# Patient Record
Sex: Male | Born: 1990 | Race: Black or African American | Hispanic: No | Marital: Single | Smoking: Former smoker
Health system: Southern US, Community
[De-identification: ages and names within clinical notes are randomized; demographics above are authoritative.]

## PROBLEM LIST (undated history)

## (undated) DIAGNOSIS — D571 Sickle-cell disease without crisis: Secondary | ICD-10-CM

---

## 2012-09-02 ENCOUNTER — Encounter (HOSPITAL_COMMUNITY): Payer: Self-pay | Admitting: Emergency Medicine

## 2012-09-02 ENCOUNTER — Inpatient Hospital Stay (HOSPITAL_COMMUNITY)
Admission: AD | Admit: 2012-09-02 | Discharge: 2012-09-04 | DRG: 426 | Disposition: A | Discharge status: Home / Self Care | Payer: BC Managed Care – PPO | Source: Ambulatory Visit | Attending: Psychiatry | Admitting: Psychiatry

## 2012-09-02 ENCOUNTER — Emergency Department (HOSPITAL_COMMUNITY)
Admission: EM | Admit: 2012-09-02 | Discharge: 2012-09-02 | Disposition: A | Discharge status: Other Acute Inpatient Hospital | Payer: BC Managed Care – PPO | Attending: Emergency Medicine | Admitting: Emergency Medicine

## 2012-09-02 DIAGNOSIS — F4321 Adjustment disorder with depressed mood: Principal | ICD-10-CM

## 2012-09-02 DIAGNOSIS — F1210 Cannabis abuse, uncomplicated: Secondary | ICD-10-CM | POA: Diagnosis present

## 2012-09-02 DIAGNOSIS — G4700 Insomnia, unspecified: Secondary | ICD-10-CM | POA: Diagnosis present

## 2012-09-02 DIAGNOSIS — R45851 Suicidal ideations: Secondary | ICD-10-CM | POA: Insufficient documentation

## 2012-09-02 DIAGNOSIS — F17200 Nicotine dependence, unspecified, uncomplicated: Secondary | ICD-10-CM | POA: Diagnosis present

## 2012-09-02 DIAGNOSIS — F329 Major depressive disorder, single episode, unspecified: Secondary | ICD-10-CM

## 2012-09-02 DIAGNOSIS — Z862 Personal history of diseases of the blood and blood-forming organs and certain disorders involving the immune mechanism: Secondary | ICD-10-CM | POA: Insufficient documentation

## 2012-09-02 DIAGNOSIS — D571 Sickle-cell disease without crisis: Secondary | ICD-10-CM | POA: Diagnosis present

## 2012-09-02 DIAGNOSIS — F3289 Other specified depressive episodes: Secondary | ICD-10-CM | POA: Insufficient documentation

## 2012-09-02 DIAGNOSIS — IMO0002 Reserved for concepts with insufficient information to code with codable children: Secondary | ICD-10-CM

## 2012-09-02 HISTORY — DX: Sickle-cell disease without crisis: D57.1

## 2012-09-02 LAB — CBC WITH DIFFERENTIAL/PLATELET
Basophils Absolute: 0.1 10*3/uL (ref 0.0–0.1)
Eosinophils Absolute: 0.1 10*3/uL (ref 0.0–0.7)
Lymphocytes Relative: 23 % (ref 12–46)
Lymphs Abs: 2.4 10*3/uL (ref 0.7–4.0)
MCV: 81.7 fL (ref 78.0–100.0)
Monocytes Relative: 18 % — ABNORMAL HIGH (ref 3–12)
Platelets: 326 10*3/uL (ref 150–400)
RDW: 15 % (ref 11.5–15.5)
WBC: 10.3 10*3/uL (ref 4.0–10.5)

## 2012-09-02 LAB — BASIC METABOLIC PANEL
BUN: 8 mg/dL (ref 6–23)
Calcium: 9.6 mg/dL (ref 8.4–10.5)
GFR calc Af Amer: >90 mL/min (ref >90)
GFR calc non Af Amer: >90 mL/min (ref >90)
Potassium: 3.9 mEq/L (ref 3.5–5.1)

## 2012-09-02 LAB — URINALYSIS, ROUTINE W REFLEX MICROSCOPIC
Bilirubin Urine: NEGATIVE
Glucose, UA: NEGATIVE mg/dL
Ketones, ur: NEGATIVE mg/dL
pH: 6 (ref 5.0–8.0)

## 2012-09-02 LAB — RAPID URINE DRUG SCREEN, HOSP PERFORMED
Amphetamines: NOT DETECTED
Barbiturates: NOT DETECTED
Benzodiazepines: NOT DETECTED

## 2012-09-02 MED ORDER — IBUPROFEN 200 MG PO TABS: 600.0000 mg | ORAL_TABLET | Freq: Three times a day (TID) | ORAL | Status: DC | PRN

## 2012-09-02 MED ORDER — LORAZEPAM 1 MG PO TABS: 1.0000 mg | ORAL_TABLET | Freq: Three times a day (TID) | ORAL | Status: DC | PRN

## 2012-09-02 MED ORDER — ONDANSETRON HCL 8 MG PO TABS: 4.0000 mg | ORAL_TABLET | Freq: Three times a day (TID) | ORAL | Status: DC | PRN

## 2012-09-02 MED ORDER — ZOLPIDEM TARTRATE 5 MG PO TABS: 5.0000 mg | ORAL_TABLET | Freq: Every evening | ORAL | Status: DC | PRN

## 2012-09-02 MED ORDER — ACETAMINOPHEN 325 MG PO TABS: 650.0000 mg | ORAL_TABLET | ORAL | Status: DC | PRN

## 2012-09-02 MED ORDER — ALUM & MAG HYDROXIDE-SIMETH 200-200-20 MG/5ML PO SUSP: 30.0000 mL | ORAL | Status: DC | PRN

## 2012-09-02 NOTE — ED Notes (Signed)
First meeting with patient. Patient is tearful and can not answer questions as to his hx. Patient was unable to eat dinner. Patient's mother states that he is a picky eater but has been unable to eat anything for the last few days. Patient denies any pain and denies any needs. Patient states he just does not want to be on earth anymore.

## 2012-09-02 NOTE — ED Provider Notes (Signed)
History  Scribed for [employer]. Rubin Payor, MD, the patient was seen in room TR08C/TR08C. This chart was scribed by Candelaria Stagers. The patient's care started at 2:33 PM   CSN: [id number]  Arrival date & time 09/02/12  1315   First MD Initiated Contact with Patient 09/02/12 1424      Chief Complaint  Patient presents with  . Depression     The history is provided by the patient. No language interpreter was used.   Garrett Valdez is a 21 y.o. male who presents to the Emergency Department complaining of depression that started about two years ago.  Pt states that he was brought in by his parents.  Parents are worried about him hurting himself.  Pt states that he does not have any plans to harm himself.  Pt smokes cigarettes and marijuana.  He denies any other drug use.  He has never been treated for depression before.     Past Medical History  Diagnosis Date  . Sickle cell anemia     History reviewed. No pertinent past surgical history.  No family history on file.  History  Substance Use Topics  . Smoking status: Current Every Day Smoker -- 1.0 packs/day  . Smokeless tobacco: Not on file  . Alcohol Use: Yes     Ocassionally       Review of Systems  Psychiatric/Behavioral:       Depression   All other systems reviewed and are negative.    Allergies  Sulfur; Claritin; Morphine and related; and Vitamin b complex  Home Medications  No current outpatient prescriptions on file.  BP 115/40  Pulse 66  Temp 98.5 F (36.9 C) (Oral)  Resp 16  SpO2 98%  Physical Exam  Nursing note and vitals reviewed. Constitutional: He is oriented to person, place, and time. He appears well-developed and well-nourished. No distress.       Flat affect, appears depressed.    HENT:  Head: Normocephalic and atraumatic.  Eyes: EOM are normal. Pupils are equal, round, and reactive to light.  Neck: Neck supple. No tracheal deviation present.  Musculoskeletal: Normal range of motion. He  exhibits no edema.  Neurological: He is alert and oriented to person, place, and time. No sensory deficit.  Skin: Skin is warm and dry.  Psychiatric: He has a normal mood and affect. His behavior is normal.    ED Course  Procedures   DIAGNOSTIC STUDIES:   COORDINATION OF CARE: 13:57 Ordered: Urinalysis, Routine w reflex microscopic; Drug screen panel, emergency, CBC with Differential; Basic metabolic panel; Ethanol     Labs Reviewed  URINALYSIS, ROUTINE W REFLEX MICROSCOPIC  URINE RAPID DRUG SCREEN (HOSP PERFORMED)  CBC WITH DIFFERENTIAL  BASIC METABOLIC PANEL  ETHANOL   No results found.   No diagnosis found.    MDM  Patient with depression. No known history of same. He states his been going on for 2 years. No clear suicidal statements. His family he reportedly made him come in. Labwork is pending, however patient is most likely medically cleared. He does smoke marijuana almost daily. He'll be seen by the ACT team.  I personally performed the services described in this documentation, which was scribed in my presence. The recorded information has been reviewed and considered.         Juliet Rude. Rubin Payor, MD 09/02/12 1501

## 2012-09-02 NOTE — ED Notes (Signed)
Gave report to Bonita Quin, Charity fundraiser at [facility].
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
## 2012-09-02 NOTE — ED Notes (Signed)
Family at bedside. 

## 2012-09-02 NOTE — ED Notes (Signed)
Patient is very emotional. He is crying and states "I don't want to be here on earth anymore".

## 2012-09-02 NOTE — ED Notes (Signed)
Patient's mother Larene Beach) can be reached at [phone].

## 2012-09-02 NOTE — ED Notes (Signed)
The patient is AOx4 and stable for transport.  He is being accompanied by security and a Comptroller.

## 2012-09-02 NOTE — ED Notes (Signed)
Pt was brought here by his parents. Stated his parents brought him here to be treated for depression. Pt is upset and crying. Pt stated that at one time he thought it would be better off without him here. Pt stated that he does not have any plans to harm himself or other. Denies hallucinations. Pt stated that he has never been tx for depression before.

## 2012-09-03 ENCOUNTER — Encounter (HOSPITAL_COMMUNITY): Payer: Self-pay | Admitting: *Deleted

## 2012-09-03 DIAGNOSIS — F329 Major depressive disorder, single episode, unspecified: Secondary | ICD-10-CM | POA: Insufficient documentation

## 2012-09-03 LAB — TSH: TSH: 1.218 u[IU]/mL (ref 0.350–4.500)

## 2012-09-03 LAB — PATHOLOGIST SMEAR REVIEW

## 2012-09-03 MED ORDER — MAGNESIUM HYDROXIDE 400 MG/5ML PO SUSP: 30.0000 mL | Freq: Every day | ORAL | Status: DC | PRN

## 2012-09-03 MED ORDER — NICOTINE 21 MG/24HR TD PT24
21.0000 mg | MEDICATED_PATCH | Freq: Every day | TRANSDERMAL | Status: DC
  Filled 2012-09-03: qty 1

## 2012-09-03 MED ORDER — TRAZODONE HCL 50 MG PO TABS
50.0000 mg | ORAL_TABLET | Freq: Every evening | ORAL | Status: DC | PRN
  Administered 2012-09-03 (×2): 50 mg via ORAL
  Filled 2012-09-03 (×5): qty 1

## 2012-09-03 NOTE — Social Work (Signed)
Aftercare Planning Group: 09/03/2012 9:45 AM  Pt did not attend d/c planning group on this date.  SW met with pt individually at this time.  Pt presents with bright mood and affect.  Pt states that he came to the hospital due to his mom overreacting and worrying about him.  Pt states that him and his mom got into an argument over text and took something he said as SI.  Pt denies SI and states that it was a misunderstanding.  Pt states that he lives in [place] and has access to transportation.  Pt is open to following up with his PCP.  SW will make appropriate referrals.  No further needs voiced by pt at this time.    [facility] Group Notes:  (Counselor/Nursing/MHT/Case Management/Adjunct)  09/03/2012 2:14 PM  Type of Therapy:  Group Therapy  Participation Level:  Active  Participation Quality:  Appropriate and Attentive  Affect:  Appropriate  Cognitive:  Alert and Appropriate  Insight:  Good  Engagement in Group:  Good  Engagement in Therapy:  Good  Modes of Intervention:  Clarification, Education, Problem-solving, Socialization and Support  Summary of Progress/Problems: The topic of discussion today was emotion regulation.  Pt actively participated in discussing what this looks like for them and how it has both positively and negatively affected their life.  Pt was able to give real life examples of how this plays out for them.     Carmina Michon 09/03/2012, 2:14 PM
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
## 2012-09-03 NOTE — Progress Notes (Signed)
[facility] MD Progress Note  09/03/2012 3:14 PM  Diagnosis:   Axis I: Major Depression, single episode Axis II: Deferred Axis III:  Past Medical History  Diagnosis Date  . Sickle cell anemia    Axis IV: problems with primary support group Axis V: 61-70 mild symptoms  ADL's:  Intact  Sleep: Fair  Appetite:  Fair  Suicidal Ideation:  Plan:  Denies Intent:  Denies Means:  Denies Homicidal Ideation:  Plan:  Denies Intent:  Denies Means:  Denies  Admits that 2 months ago was feeling down depressed and went to see a counselor who kept him for three hours talking about things. He was wanting to send him to get some sleeping medications, but he did not pursue it. He did not go back. He admits that he has been under a lot of stress. 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 He is also involved in martial arts. He admits that he has been sad, at times feels like crying, His sleep is erratic in part because of his work schedule. He is in a committed relationship with his girlfriend. His mother has been worried about him as he is "isolating" He came here as he had sent a text to his mother and she felt he was saying he was going to hurt himself. He was given the ultimatum of coming here or to a "mental institution" in [place], where his mother works.  Mental Status Examination/Evaluation: Objective:  Appearance: Disheveled  Eye Contact::  Fair  Speech:  Clear and Coherent  Volume:  Normal  Mood:  Sad, anxious, worried  Affect:  Restricted  Thought Process:  Coherent, Linear and Logical  Orientation:  Full  Thought Content:  WDL  Suicidal Thoughts:  No  Homicidal Thoughts:  No  Memory:  Immediate;   Fair Recent;   Fair Remote;   Fair  Judgement:  Fair  Insight:  Shallow  Psychomotor Activity:  Normal  Concentration:  Fair  Recall:  Fair  Akathisia:  No  Handed:  Right  AIMS (if indicated):     Assets:  Communication Skills Housing Intimacy Social Support Transportation Vocational/Educational   Sleep:  Number of Hours: 3.5    Vital Signs:Blood pressure 117/49, pulse 56, temperature 98.3 F (36.8 C), temperature source Oral, resp. rate 16, height 5' 6.5" (1.689 m), weight 51.256 kg (113 lb). Current Medications: Current Facility-Administered Medications  Medication Dose Route Frequency Provider Last Rate Last Dose  . magnesium hydroxide (MILK OF MAGNESIA) suspension 30 mL  30 mL Oral Daily PRN Kerry Hough, PA      . traZODone (DESYREL) tablet 50 mg  50 mg Oral QHS,MR X 1 Kerry Hough, PA   50 mg at 09/03/12 0150  . DISCONTD: nicotine (NICODERM CQ - dosed in mg/24 hours) patch 21 mg  21 mg Transdermal Q0600 Kerry Hough, PA       Facility-Administered Medications Ordered in Other Encounters  Medication Dose Route Frequency Provider Last Rate Last Dose  . DISCONTD: acetaminophen (TYLENOL) tablet 650 mg  650 mg Oral Q4H PRN Juliet Rude. Pickering, MD      . DISCONTD: alum & mag hydroxide-simeth (MAALOX/MYLANTA) 200-200-20 MG/5ML suspension 30 mL  30 mL Oral PRN Juliet Rude. Pickering, MD      . DISCONTD: ibuprofen (ADVIL,MOTRIN) tablet 600 mg  600 mg Oral Q8H PRN Juliet Rude. Pickering, MD      . DISCONTD: LORazepam (ATIVAN) tablet 1 mg  1 mg Oral Q8H PRN Juliet Rude. Rubin Payor, MD      .  DISCONTD: ondansetron (ZOFRAN) tablet 4 mg  4 mg Oral Q8H PRN Juliet Rude. Pickering, MD      . DISCONTD: zolpidem (AMBIEN) tablet 5 mg  5 mg Oral QHS PRN Juliet Rude. Rubin Payor, MD        Lab Results:  Results for orders placed during the hospital encounter of 09/02/12 (from the past 48 hour(s))  TSH     Status: Normal   Collection Time   09/03/12  6:35 AM      Component Value Range Comment   TSH 1.218  0.350 - 4.500 uIU/mL     Physical Findings: AIMS: Facial and Oral Movements Muscles of Facial Expression: None, normal Lips and Perioral Area: None, normal Jaw: None, normal Tongue: None, normal,Extremity Movements Upper (arms, wrists, hands, fingers): None, normal Lower (legs, knees, ankles,  toes): None, normal,  , Overall Severity Severity of abnormal movements (highest score from questions above): None, normal Incapacitation due to abnormal movements: None, normal Patient's awareness of abnormal movements (rate only patient's report): No Awareness, Dental Status Current problems with teeth and/or dentures?: No Does patient usually wear dentures?: No  CIWA:    COWS:     Treatment Plan Summary: Daily contact with patient to assess and evaluate symptoms and progress in treatment Medication management  Plan: Get more information/coping skills/stress management/CBT           Consider starting an antidepressant  Nachmen Mansel A 09/03/2012, 3:14 PM

## 2012-09-03 NOTE — Progress Notes (Signed)
[facility] Group Notes:  (Counselor/Nursing/MHT/Case Management/Adjunct)  09/03/2012 1:15-2:30 Emotion Regulation Group  Type of Therapy:  Group Therapy  Participation Level:  Active  Participation Quality:  Appropriate and Attentive  Affect:  Appropriate  Cognitive:  Alert, Appropriate and Oriented  Insight:  Good  Engagement in Group:  Good  Engagement in Therapy:  Good  Modes of Intervention:  Problem-solving and Exploration  Summary of Progress/Problems: Patient was supportive of other patients during group.  He shared openly about past problems and his decisions that allowed him to overcome those obstacles.   Terrel Nesheiwat L 09/03/2012, 3:20 PM

## 2012-09-03 NOTE — Progress Notes (Addendum)
[facility] Adult Inpatient Family/Significant Other Suicide Prevention Education  Suicide Prevention Education:  Education Completed; Garrett Valdez - mother [phone]),  (name of family member/significant other) has been identified by the patient as the family member/significant other with whom the patient will be residing, and identified as the person(s) who will aid the patient in the event of a mental health crisis (suicidal ideations/suicide attempt).  With written consent from the patient, the family member/significant other has been provided the following suicide prevention education, prior to the and/or following the discharge of the patient.  The suicide prevention education provided includes the following:  Suicide risk factors  Suicide prevention and interventions  National Suicide Hotline telephone number  [facility] assessment telephone number  [facility] Emergency Assistance 911  [facility] and/or Residential Mobile Crisis Unit telephone number  Request made of family/significant other to:  Remove weapons (e.g., guns, rifles, knives), all items previously/currently identified as safety concern.    Remove drugs/medications (over-the-counter, prescriptions, illicit drugs), all items previously/currently identified as a safety concern.  The family member/significant other verbalizes understanding of the suicide prevention education information provided.  The family member/significant other agrees to remove the items of safety concern listed above. Garrett Valdez states that pt was showing signs of depression such as tearfulness and hopeless/helpless feelings.  Garrett Valdez states that she works at a mental health hospital in [place] and told Garrett Valdez that he either goes to [facility] or her hospital to be assessed.  Garrett Valdez agreed that pt appears to be doing fine now.  Garrett Valdez states that she would like for Garrett Valdez to talk to a therapist but understand he isn't ready  to.  Garrett Valdez had no further concerns about pt's discharge.    Garrett Valdez 09/03/2012, 3:29 PM

## 2012-09-03 NOTE — Progress Notes (Signed)
[facility] Group Notes:  (Counselor/Nursing/MHT/Case Management/Adjunct)  09/03/2012 12:16 PM  Type of Therapy:  Psychoeducational Skills  Participation Level:  Active  Participation Quality:  Appropriate, Attentive and Sharing  Affect:  Appropriate  Cognitive:  Alert, Appropriate and Oriented  Insight:  Good  Engagement in Group:  Good  Engagement in Therapy:  n/a  Modes of Intervention:  Activity, Education, Problem-solving, Socialization and Support  Summary of Progress/Problems: Garrett Valdez attended Psychoeducational group that focused on using quality time with support systems/individuals to engage in healthy coping skills. Garrett Valdez participated in activity guessing about self and peers. Garrett Valdez was active while group discussed who their support systems are, how they can spend positive quality time together as a way to strengthen the relationship and use coping skills. Garrett Valdez was given a homework assignment to find two ways to improve his support systems and twenty activities he can do to spend quality time with his supports.   Wandra Scot 09/03/2012, 12:16 PM

## 2012-09-03 NOTE — Progress Notes (Signed)
Patient states "I do not belong here, I do not want to be here." Patient educated re 72 hour notice. Patient verbalized understanding of 72 hour notice. Patient elected to sign 72 hour notice today, 08-04-2012 at 0914. MD aware. Will continue to monitor.
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
## 2012-09-03 NOTE — Tx Team (Signed)
Initial Interdisciplinary Treatment Plan  PATIENT STRENGTHS: (choose at least two) Average or above average intelligence Capable of independent living Financial means Religious Affiliation  PATIENT STRESSORS: Health problems Marital or family conflict   PROBLEM LIST: Problem List/Patient Goals Date to be addressed Date deferred Reason deferred Estimated date of resolution  Suicide Ideation 09/03/12     Depression 09/03/12                                                DISCHARGE CRITERIA:  Ability to meet basic life and health needs Adequate post-discharge living arrangements Improved stabilization in mood, thinking, and/or behavior Motivation to continue treatment in a less acute level of care Verbal commitment to aftercare and medication compliance  PRELIMINARY DISCHARGE PLAN: Outpatient therapy Return to previous living arrangement  PATIENT/FAMIILY INVOLVEMENT: This treatment plan has been presented to and reviewed with the patient, Garrett Valdez, and/or family member.  The patient and family have been given the opportunity to ask questions and make suggestions.  Roselie Skinner [facility] 09/03/2012, 1:25 AM

## 2012-09-03 NOTE — Progress Notes (Signed)
  D) Patient pleasant and cooperative upon my assessment. Patient resting quietly in bed after breakfast. Patient states slept " okay," and  appetite is "okay." Patient rates depression as   0/10, patient rates hopeless feelings as  0 /10. Patient denies SI/HI, denies A/V hallucinations.   A) Patient offered support and encouragement, patient encouraged to discuss feelings/concerns with staff. Patient verbalized understanding. Patient monitored Q15 minutes for safety. Patient met with MD and treatment team to discuss today's goals and plan of care.  R) Patient active on unit, attending some groups in day room and meals in dining room.  Patient taking medications as ordered. Will continue to monitor.

## 2012-09-04 DIAGNOSIS — F4321 Adjustment disorder with depressed mood: Principal | ICD-10-CM

## 2012-09-04 NOTE — Progress Notes (Signed)
Psychoeducational Group Note  Date:  09/04/2012 Time:  2000  Group Topic/Focus:  Wrap-Up Group:   The focus of this group is to help patients review their daily goal of treatment and discuss progress on daily workbooks.  Participation Level:  Active  Participation Quality:  Attentive  Affect:  Appropriate  Cognitive:  Appropriate  Insight:  Good  Engagement in Group:  Good  Additional Comments:  Patient shared that today was a good day and a "straight day" and that he looked forward to going home soon.  Garrett Valdez, Newton Pigg 09/04/2012, 12:33 AM

## 2012-09-04 NOTE — [facility] Suicide Risk Assessment (Signed)
Suicide Risk Assessment  Discharge Assessment     Demographic Factors:  Adolescent or young adult  Mental Status Per Nursing Assessment::   On Admission:   (denied)  Current Mental Status by Physician: In full contact with reality. Mood euthymic affect bright broad. Denies any active suicidal, plans, intent. Adds that he was never suicidal.   Loss Factors: NA  Historical Factors: None  Risk Reduction Factors:   Sense of responsibility to family, Employed, Living with another person, especially a relative, Positive social support and Positive coping skills or problem solving skills  Continued Clinical Symptoms: Depressive symptoms resolved  Cognitive Features That Contribute To Risk: None    Suicide Risk:  Minimal: No identifiable suicidal ideation.  Patients presenting with no risk factors but with morbid ruminations; may be classified as minimal risk based on the severity of the depressive symptoms  Discharge Diagnoses:   AXIS I:  Adjustment Disorder with Depressed Mood AXIS II:  Deferred AXIS III:   Past Medical History  Diagnosis Date  . Sickle cell anemia    AXIS IV:  Responsabilites AXIS V:  61-70 mild symptoms  Plan Of Care/Follow-up recommendations:  Activity:  Back to work Diet:  Regular Wants to follow up with his PCP who has known him since he was a child and follow his recommendations. Willing to work in therapy, not wanting to take medications. Found out that his girl friend is pregnant. Very excited about it. Is patient on multiple antipsychotic therapies at discharge:  No   Has Patient had three or more failed trials of antipsychotic monotherapy by history:  No  Recommended Plan for Multiple Antipsychotic Therapies: N/A  Marton Malizia A 09/04/2012, 11:03 AM

## 2012-09-04 NOTE — Progress Notes (Signed)
[facility] Case Management Discharge Plan:  Will you be returning to the same living situation after discharge: Yes,  returning home At discharge, do you have transportation home?:Yes,  access to transportation Do you have the ability to pay for your medications:Yes,  access to meds   Release of information consent forms completed and in the chart;  Patient's signature needed at discharge.  Patient to Follow up at:  Follow-up Information    Follow up with Dr. Hart Robinsons (primary care doctor). On 09/11/2012. (Appointment scheduled at 2:30 pm)    Contact information:   [address] [place], [place] [address] [phone] Fax: [phone]         Patient denies SI/HI:   Yes,  denies SI/HI    Safety Planning and Suicide Prevention discussed:  Yes,  discussed with pt today  Barrier to discharge identified:No.  Summary and Recommendations: Pt attended discharge planning group and actively participated in group.  SW provided pt with today's workbook.  Pt presents with bright mood and affect.  Pt denies depression, anxiety and SI/HI.  Pt reports feeling stable to d/c today.  No recommendations from SW.  No further needs voiced by pt.  Pt stable to discharge.     Garrett Valdez 09/04/2012, 11:55 AM

## 2012-09-04 NOTE — Progress Notes (Signed)
Patient denies SI/HI, denies A/V hallucinations. Patient verbalizes understanding of discharge instructions, follow up care and prescriptions. Patient given all belongings from [facility] locker. Patient escorted out by staff, transported by family. 

## 2012-09-04 NOTE — Progress Notes (Signed)
Pt reports he is doing fine.  He denies SI/HI/AV.  He says he really doesn't belong here and wants to go home.  He signed a 72 hr request for discharge earlier today.  Presently he is in the dayroom watching TV.  Pt encouraged to make his needs known to staff.  Pt voiced understanding and voices no needs/complaints at this time.  Safety maintained with q15 minute checks.
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
## 2012-09-05 NOTE — Tx Team (Signed)
Interdisciplinary Treatment Plan Update (Adult)  Date:  09/05/2012  Time Reviewed:  8:06 AM   Progress in Treatment: Attending groups: Yes Participating in groups:  Yes Taking medication as prescribed: Yes Tolerating medication:  Yes Family/Significant othe contact made:  Pt refused Patient understands diagnosis:  Yes Discussing patient identified problems/goals with staff:  Yes Medical problems stabilized or resolved:  Yes Denies suicidal/homicidal ideation: Yes Issues/concerns per patient self-inventory:  None identified Other: N/A  New problem(s) identified: None Identified  Reason for Continuation of Hospitalization: Stable to d/c  Interventions implemented related to continuation of hospitalization: Stable to d/c  Additional comments: N/A  Estimated length of stay: D/C today  Discharge Plan: Pt will follow up with   New goal(s): N/A  Review of initial/current patient goals per problem list:    1.  Goal (s): Reduce depressive and anxiety symptoms  Met:  Yes  Target date: by discharge  As evidenced by: Reducing depression from a 10 to a 3 as reported by pt.  Pt rates at a 0 today.   2.  Goal(s): Eliminate SI  Met:  Yes  Target date: by discharge  As evidenced by: Pt denies SI.    Attendees: Patient:   09/05/2012 8:06 AM   Family:     Physician:  Geoffery Lyons, MD 09/05/2012 8:06 AM   Nursing:  09/05/2012 8:06 AM   Clinical Social Worker:  Reyes Ivan, LCSWA 09/05/2012 8:06 AM   Other:  09/05/2012 8:06 AM   Other:     Other:     Other:     Other:      Scribe for Treatment Team:   Reyes Ivan 09/05/2012 8:06 AM

## 2012-09-08 NOTE — Progress Notes (Signed)
Patient Discharge Instructions:  After Visit Summary (AVS):   Faxed to:  09/08/12 Psychiatric Admission Assessment Note:   Faxed to:  09/08/12 Suicide Risk Assessment - Discharge Assessment:   Faxed to:  09/08/12 Faxed/Sent to the Next Level Care provider:  09/08/12 Faxed to Dr. Hart Robinsons @ [phone] Jerelene Redden, 09/08/2012, 2:59 PM

## 2012-09-10 NOTE — Progress Notes (Signed)
[facility] MD Progress Note  09/10/2012 4:25 PM Garrett Valdez  MRN:  [id number]  Diagnosis:   Axis I: Mood Disorder NOS, Cannabis Abuse Axis II: Deferred Axis III:  Past Medical History  Diagnosis Date  . Sickle cell anemia    Axis IV: educational problems, occupational problems and problems with primary support group Axis V: 51-60 moderate symptoms  ADL's:  Intact  Sleep: Fair  Appetite:  Fair  Suicidal Ideation:  Plan:  Denies Intent:  Denies Means:  Denies Homicidal Ideation:  Plan:  Denies Intent:  Denies Means:  denies  Giordano wants to leave the hospital. He has no clear plan on where he is going to go, has no transportation, He is very guarded, irritated when asked what is he planning to do. His family will be able to pick him up in the morning. He does not want to wait. Mental Status Examination/Evaluation: Objective:  Appearance: Fairly Groomed  Patent attorney::  Fair  Speech:  Clear and Coherent  Volume:  Increased  Mood:  Angry and Irritable  Affect:  irritated, defiant  Thought Process:  Coherent and Goal Directed  Orientation:  Full  Thought Content:  Wanting to leave the hospital. Claiming that he said he was going to hurt himself because he wanted a place to stay  Suicidal Thoughts:  No  Homicidal Thoughts:  No  Memory:  Immediate;   Fair Recent;   Fair Remote;   Fair  Judgement:  Poor  Insight:  Lacking  Psychomotor Activity:  Increased  Concentration:  Fair  Recall:  Fair  Akathisia:  No  Handed:  Right  AIMS (if indicated):     Assets:  Communication Skills  Sleep:  Number of Hours: 6    Vital Signs:Blood pressure 130/66, pulse 59, temperature 98.6 F (37 C), temperature source Oral, resp. rate 15, height 5' 6.5" (1.689 m), weight 51.256 kg (113 lb). Current Medications: No current facility-administered medications for this encounter.   No current outpatient prescriptions on file.    Lab Results: No results found for this or any previous  visit (from the past 48 hour(s)).   Will hold him until the morning when his family will pick him up. He is labile, impulsive. Will work on Pharmacologist, anger management and try to establish a better plan for follow up. Jalana Moore A 09/10/2012, 4:25 PM

## 2012-09-15 NOTE — H&P (Signed)
Agree with assessment and disposition  Shakela Donati A. Yulian Gosney, M.D. 

## 2012-09-16 NOTE — Discharge Summary (Signed)
Physician Discharge Summary Note  Patient:  Garrett Valdez is an 21 y.o., male MRN:  [id number] DOB:  25-Jul-1991 Patient phone:  [phone] (home)  Patient address:   [address] [place] [address]   Date of Admission:  09/02/2012 Date of Discharge: 09/04/2012  Discharge Diagnoses: Principal Problem:  *Adjustment disorder with depressed mood  Axis Diagnosis:  Discharge Diagnoses:  AXIS I: Adjustment Disorder with Depressed Mood  AXIS II: Deferred  AXIS III:  Past Medical History   Diagnosis  Date   .  Sickle cell anemia    AXIS IV: Responsabilites  AXIS V: 61-70 mild symptoms   Level of Care:  OP  Hospital Course: Tadd was admitted for crisis management and stabilization after presenting to the ED reporting increasing depression with thoughts of suicide. He did not have a plan, but stated he "just didn't want to be on the planet anymore."  He presented with no risk factors but only morbid ruminations.  He was felt to be a minimal risk for suicide.  And upon discharge the patient acknowledged that he was never suicidal.  He did not want to take medication, his mood was euthymic and bright and he had no loss factors.   He was noted to have positive social support and was willing to work on any remaining issues with an outpatient therapist.  Medication List  Notice    You have not been prescribed any medications.  They were also enrolled in group counseling sessions and activities in which they participated actively. Follow-up Information    Follow up with Dr. Hart Robinsons (primary care doctor). On 09/11/2012. (Appointment scheduled at 2:30 pm)    Contact information:   [address] [place], [place] [address] [phone] Fax: [phone]   Upon discharge, patient adamantly denies suicidal, homicidal ideations, auditory, visual hallucinations and or delusional thinking. They left [facility] with all personal belongings via personal transportation in no apparent  distress.  Consults:  none  Significant Diagnostic Studies:  none  Discharge Vitals:   Blood pressure 130/66, pulse 59, temperature 98.6 F (37 C), temperature source Oral, resp. rate 15, height 5' 6.5" (1.689 m), weight 51.256 kg (113 lb)..  Mental Status Exam: See Mental Status Examination and Suicide Risk Assessment completed by Attending Physician prior to discharge.  Discharge destination:  Home  Is patient on multiple antipsychotic therapies at discharge:  No  Has Patient had three or more failed trials of antipsychotic monotherapy by history: N/A Recommended Plan for Multiple Antipsychotic Therapies: N/A Follow-up recommendations:   Activities: Resume typical activities Diet: Resume typical diet Tests: none Other: Follow up with outpatient provider and report any side effects to out patient prescriber.  Comments:   Patient is instructed and cautioned to not engage in alcohol and or illegal drug use while on prescription medicines. In the event of worsening symptoms, patient is instructed to call the crisis hotline, 911 and or go to the nearest ED for appropriate evaluation and treatment of symptoms.  Signed:  Rona Ravens. Fallon Howerter [facility] 09/16/2012 3:53 PM

## 2012-09-21 NOTE — Discharge Summary (Signed)
Agree with assessment and plan Danajah Birdsell A. Cing , M.D. 

## 2015-01-07 ENCOUNTER — Emergency Department (INDEPENDENT_AMBULATORY_CARE_PROVIDER_SITE_OTHER)
Admission: EM | Admit: 2015-01-07 | Discharge: 2015-01-07 | Disposition: A | Discharge status: Home / Self Care | Payer: BLUE CROSS/BLUE SHIELD | Source: Home / Self Care | Attending: Family Medicine | Admitting: Family Medicine

## 2015-01-07 ENCOUNTER — Encounter (HOSPITAL_COMMUNITY): Payer: Self-pay | Admitting: Emergency Medicine

## 2015-01-07 DIAGNOSIS — J029 Acute pharyngitis, unspecified: Secondary | ICD-10-CM

## 2015-01-07 LAB — POCT RAPID STREP A: STREPTOCOCCUS, GROUP A SCREEN (DIRECT): NEGATIVE

## 2015-01-07 NOTE — Discharge Instructions (Signed)

## 2015-01-07 NOTE — ED Provider Notes (Signed)
CSN: [id number]     Arrival date & time 01/07/15  1023 History   First MD Initiated Contact with Patient 01/07/15 1110     Chief Complaint  Patient presents with  . Sore Throat  . Otalgia    right   (Consider location/radiation/quality/duration/timing/severity/associated sxs/prior Treatment) HPI  24 y/o male with sickle cell [place] disease here with 2 days of sore throat, malaise, tender LAD, and nausea. He states that his sore throat has persisted over the last 2 days but now it hurts so bad that he has a hard time eating. He is still drinking plenty of fluids. HE also notes R sided tender LAD just below his ear. He denies cough, dyspnea, muscle pain, and chest pain. He has not had strep for 2 years. He takes amox daily and has recently increased his dose to try to treat the strep. 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 He also reports a single episode of emesis just after the sore throat started 2 days ago.   Past Medical History  Diagnosis Date  . Sickle cell anemia    History reviewed. No pertinent past surgical history. History reviewed. No pertinent family history. History  Substance Use Topics  . Smoking status: Current Every Day Smoker -- 0.10 packs/day    Types: Cigarettes  . Smokeless tobacco: Never Used  . Alcohol Use: No    Review of Systems  Constitutional: Positive for activity change. Negative for fever, chills, diaphoresis and appetite change.  HENT: Positive for sore throat. Negative for congestion.   Respiratory: Negative for chest tightness and shortness of breath.   Cardiovascular: Negative for chest pain.     Per HPI  Allergies  Sulfur; Claritin; Morphine and related; and Vitamin b complex  Home Medications   Prior to Admission medications   Medication Sig Start Date End Date Taking? Authorizing Provider  amoxicillin (AMOXIL) 500 MG capsule Take 250 mg by mouth 2 (two) times daily.   Yes Historical Provider, MD   BP 115/72 mmHg  Pulse  81  Temp(Src) 98 F (36.7 C) (Oral)  Resp 16  SpO2 99% Physical Exam  Constitutional: He is oriented to person, place, and time. He appears well-developed and well-nourished. No distress.  HENT:  Head: Normocephalic and atraumatic.  Mouth/Throat: No oropharyngeal exudate or tonsillar abscesses.  Swollen red and swollen with exudates.   Eyes: Conjunctivae are normal. Right eye exhibits no discharge. Left eye exhibits no discharge.  Neck: Neck supple.  Cardiovascular: Normal rate, regular rhythm and normal heart sounds.   No murmur heard. Pulmonary/Chest: Effort normal and breath sounds normal. No respiratory distress.  Musculoskeletal: He exhibits no edema.  Lymphadenopathy:    He has cervical adenopathy.  Neurological: He is alert and oriented to person, place, and time.  Skin: He is not diaphoretic.  Nursing note and vitals reviewed.   ED Course  Procedures (including critical care time) Labs Review Labs Reviewed  POCT RAPID STREP A ([facility] CARE ONLY)    Imaging Review No results found.   MDM   1. Pharyngitis    24 y/o male with Sickle cell [place] disease here with sore throat consistent with strep pharyngitis. His rapid strep is negative but given his nausea, tender LAD, tonsillar exudates, and severe sore throat we will go ahead and treat for presumed amox resistant strep pharyngitis with omnicef. Red flags for return provided, discussed supportive care.   Pt seen and discussed with Dr. Artis Flock and  he agrees with the plan as discussed above.      Elenora GammaSamuel L Bradshaw, MD 01/07/15 1200

## 2015-01-07 NOTE — ED Notes (Signed)
Pt started with right ear pain and a sore throat two days ago.  Pt denies any nasal congestion, fever, or cough.

## 2015-01-09 LAB — CULTURE, GROUP A STREP

## 2015-01-09 NOTE — ED Notes (Signed)
Throat culture: Strep beta hemolytic not group A.  Pt. adequately treated with Amoxicillin.  Needs notified. 01/09/2015

## 2015-01-11 ENCOUNTER — Telehealth (HOSPITAL_COMMUNITY): Payer: Self-pay | Admitting: *Deleted

## 2015-01-11 ENCOUNTER — Encounter (HOSPITAL_COMMUNITY): Payer: Self-pay | Admitting: *Deleted

## 2015-01-11 ENCOUNTER — Inpatient Hospital Stay (HOSPITAL_COMMUNITY)
Admission: EM | Admit: 2015-01-11 | Discharge: 2015-01-16 | DRG: 812 | Disposition: A | Discharge status: Home / Self Care | Payer: BLUE CROSS/BLUE SHIELD | Attending: Internal Medicine | Admitting: Internal Medicine

## 2015-01-11 DIAGNOSIS — Z79899 Other long term (current) drug therapy: Secondary | ICD-10-CM | POA: Diagnosis not present

## 2015-01-11 DIAGNOSIS — F17210 Nicotine dependence, cigarettes, uncomplicated: Secondary | ICD-10-CM | POA: Diagnosis present

## 2015-01-11 DIAGNOSIS — J029 Acute pharyngitis, unspecified: Secondary | ICD-10-CM | POA: Diagnosis present

## 2015-01-11 DIAGNOSIS — D5700 Hb-SS disease with crisis, unspecified: Principal | ICD-10-CM | POA: Diagnosis present

## 2015-01-11 DIAGNOSIS — Z9114 Patient's other noncompliance with medication regimen: Secondary | ICD-10-CM | POA: Diagnosis present

## 2015-01-11 LAB — COMPREHENSIVE METABOLIC PANEL
ALT: 10 U/L (ref 0–53)
ALT: 11 U/L (ref 0–53)
AST: 24 U/L (ref 0–37)
AST: 22 U/L (ref 0–37)
Albumin: 4.2 g/dL (ref 3.5–5.2)
Albumin: 4.2 g/dL (ref 3.5–5.2)
Alkaline Phosphatase: 79 U/L (ref 39–117)
Alkaline Phosphatase: 74 U/L (ref 39–117)
Anion gap: 5 (ref 5–15)
Anion gap: 3 — ABNORMAL LOW (ref 5–15)
BILIRUBIN TOTAL: 1.5 mg/dL — AB (ref 0.3–1.2)
BUN: 6 mg/dL (ref 6–23)
BUN: 6 mg/dL (ref 6–23)
CALCIUM: 8.8 mg/dL (ref 8.4–10.5)
CHLORIDE: 108 mmol/L (ref 96–112)
CO2: 25 mmol/L (ref 19–32)
CO2: 28 mmol/L (ref 19–32)
CREATININE: 0.84 mg/dL (ref 0.50–1.35)
Calcium: 8.7 mg/dL (ref 8.4–10.5)
Chloride: 108 mmol/L (ref 96–112)
Creatinine, Ser: 0.7 mg/dL (ref 0.50–1.35)
GFR calc Af Amer: >90 mL/min (ref >90)
GLUCOSE: 82 mg/dL (ref 70–99)
Glucose, Bld: 116 mg/dL — ABNORMAL HIGH (ref 70–99)
POTASSIUM: 4 mmol/L (ref 3.5–5.1)
Potassium: 3.9 mmol/L (ref 3.5–5.1)
SODIUM: 138 mmol/L (ref 135–145)
SODIUM: 139 mmol/L (ref 135–145)
Total Bilirubin: 1.2 mg/dL (ref 0.3–1.2)
Total Protein: 7.1 g/dL (ref 6.0–8.3)
Total Protein: 6.9 g/dL (ref 6.0–8.3)

## 2015-01-11 LAB — CBC WITH DIFFERENTIAL/PLATELET
Band Neutrophils: 0 % (ref 0–10)
Basophils Absolute: 0.1 10*3/uL (ref 0.0–0.1)
Basophils Relative: 1 % (ref 0–1)
Blasts: 0 %
EOS ABS: 0.3 10*3/uL (ref 0.0–0.7)
Eosinophils Relative: 3 % (ref 0–5)
HCT: 35.1 % — ABNORMAL LOW (ref 39.0–52.0)
Hemoglobin: 12.9 g/dL — ABNORMAL LOW (ref 13.0–17.0)
LYMPHS ABS: 1.7 10*3/uL (ref 0.7–4.0)
Lymphocytes Relative: 20 % (ref 12–46)
MCH: 29 pg (ref 26.0–34.0)
MCHC: 36.8 g/dL — ABNORMAL HIGH (ref 30.0–36.0)
MCV: 78.9 fL (ref 78.0–100.0)
MONOS PCT: 12 % (ref 3–12)
MYELOCYTES: 0 %
Metamyelocytes Relative: 0 %
Monocytes Absolute: 1 10*3/uL (ref 0.1–1.0)
NEUTROS PCT: 64 % (ref 43–77)
Neutro Abs: 5.5 10*3/uL (ref 1.7–7.7)
PLATELETS: 401 10*3/uL — AB (ref 150–400)
PROMYELOCYTES ABS: 0 %
RBC: 4.45 MIL/uL (ref 4.22–5.81)
RDW: 16.7 % — AB (ref 11.5–15.5)
WBC: 8.6 10*3/uL (ref 4.0–10.5)
nRBC: 0 /100 WBC

## 2015-01-11 LAB — URINALYSIS, ROUTINE W REFLEX MICROSCOPIC
BILIRUBIN URINE: NEGATIVE
Glucose, UA: NEGATIVE mg/dL
HGB URINE DIPSTICK: NEGATIVE
Ketones, ur: NEGATIVE mg/dL
Leukocytes, UA: NEGATIVE
Nitrite: NEGATIVE
PH: 6.5 (ref 5.0–8.0)
Protein, ur: NEGATIVE mg/dL
Specific Gravity, Urine: 1.014 (ref 1.005–1.030)
Urobilinogen, UA: 1 mg/dL (ref 0.0–1.0)

## 2015-01-11 LAB — RETICULOCYTES
RBC.: 4.45 MIL/uL (ref 4.22–5.81)
RBC.: 4.14 MIL/uL — ABNORMAL LOW (ref 4.22–5.81)
Retic Count, Absolute: 195.8 10*3/uL — ABNORMAL HIGH (ref 19.0–186.0)
Retic Count, Absolute: 182.2 10*3/uL (ref 19.0–186.0)
Retic Ct Pct: 4.4 % — ABNORMAL HIGH (ref 0.4–3.1)
Retic Ct Pct: 4.4 % — ABNORMAL HIGH (ref 0.4–3.1)

## 2015-01-11 LAB — LACTATE DEHYDROGENASE: LDH: 168 U/L (ref 94–250)

## 2015-01-11 LAB — MAGNESIUM: MAGNESIUM: 2 mg/dL (ref 1.5–2.5)

## 2015-01-11 MED ORDER — LIDOCAINE 5 % EX PTCH
2.0000 | MEDICATED_PATCH | CUTANEOUS | Status: DC
Start: 2015-01-12 — End: 2015-01-16
  Administered 2015-01-12 – 2015-01-13 (×2): 2 via TRANSDERMAL
  Filled 2015-01-11 (×5): qty 2

## 2015-01-11 MED ORDER — GABAPENTIN 300 MG PO CAPS
300.0000 mg | ORAL_CAPSULE | Freq: Three times a day (TID) | ORAL | Status: DC
  Administered 2015-01-11 – 2015-01-15 (×13): 300 mg via ORAL
  Filled 2015-01-11 (×16): qty 1

## 2015-01-11 MED ORDER — ENOXAPARIN SODIUM 40 MG/0.4ML ~~LOC~~ SOLN
40.0000 mg | SUBCUTANEOUS | Status: DC
  Filled 2015-01-11 (×7): qty 0.4

## 2015-01-11 MED ORDER — ALUM & MAG HYDROXIDE-SIMETH 200-200-20 MG/5ML PO SUSP
15.0000 mL | ORAL | Status: DC | PRN
  Filled 2015-01-11: qty 30

## 2015-01-11 MED ORDER — CAMPHOR-MENTHOL 0.5-0.5 % EX LOTN: TOPICAL_LOTION | CUTANEOUS | Status: DC | PRN

## 2015-01-11 MED ORDER — SODIUM CHLORIDE 0.9 % IJ SOLN: 9.0000 mL | INTRAMUSCULAR | Status: DC | PRN

## 2015-01-11 MED ORDER — AMOXICILLIN 250 MG PO CAPS
250.0000 mg | ORAL_CAPSULE | Freq: Two times a day (BID) | ORAL | Status: DC
  Administered 2015-01-11 – 2015-01-12 (×2): 250 mg via ORAL
  Filled 2015-01-11 (×3): qty 1

## 2015-01-11 MED ORDER — HYDROMORPHONE HCL 2 MG/ML IJ SOLN: 2.0000 mg | INTRAMUSCULAR | Status: DC | PRN

## 2015-01-11 MED ORDER — SODIUM CHLORIDE 0.45 % IV SOLN
INTRAVENOUS | Status: DC
  Administered 2015-01-11 – 2015-01-14 (×4): via INTRAVENOUS
  Administered 2015-01-14: 150 mL/h via INTRAVENOUS
  Administered 2015-01-14 – 2015-01-15 (×2): via INTRAVENOUS

## 2015-01-11 MED ORDER — POLYETHYLENE GLYCOL 3350 17 G PO PACK: 17.0000 g | PACK | Freq: Every day | ORAL | Status: DC | PRN

## 2015-01-11 MED ORDER — ONDANSETRON HCL 4 MG PO TABS: 4.0000 mg | ORAL_TABLET | ORAL | Status: DC | PRN

## 2015-01-11 MED ORDER — ONDANSETRON HCL 4 MG/2ML IJ SOLN
4.0000 mg | Freq: Once | INTRAMUSCULAR | Status: AC
  Administered 2015-01-11: 4 mg via INTRAVENOUS
  Filled 2015-01-11: qty 2

## 2015-01-11 MED ORDER — NALOXONE HCL 0.4 MG/ML IJ SOLN: 0.4000 mg | INTRAMUSCULAR | Status: DC | PRN

## 2015-01-11 MED ORDER — DOXEPIN HCL 10 MG PO CAPS
10.0000 mg | ORAL_CAPSULE | Freq: Every day | ORAL | Status: DC
  Administered 2015-01-11 – 2015-01-15 (×5): 10 mg via ORAL
  Filled 2015-01-11 (×6): qty 1

## 2015-01-11 MED ORDER — CAMPHOR-MENTHOL 0.5-0.5 % EX LOTN
TOPICAL_LOTION | CUTANEOUS | Status: DC | PRN
  Administered 2015-01-11: 13:00:00 via TOPICAL
  Filled 2015-01-11: qty 222

## 2015-01-11 MED ORDER — FENTANYL 50 MCG/ML IV PCA SOLN
INTRAVENOUS | Status: DC
  Administered 2015-01-11: 17:00:00 via INTRAVENOUS
  Filled 2015-01-11 (×3): qty 25

## 2015-01-11 MED ORDER — OXYCODONE HCL ER 10 MG PO T12A
10.0000 mg | EXTENDED_RELEASE_TABLET | Freq: Two times a day (BID) | ORAL | Status: DC
  Administered 2015-01-11 – 2015-01-15 (×8): 10 mg via ORAL
  Filled 2015-01-11 (×8): qty 1

## 2015-01-11 MED ORDER — FENTANYL 50 MCG/ML IV PCA SOLN
INTRAVENOUS | Status: DC
  Administered 2015-01-11 (×2): 300 ug via INTRAVENOUS
  Administered 2015-01-12: 50 ug via INTRAVENOUS
  Administered 2015-01-12: 200 ug via INTRAVENOUS
  Administered 2015-01-12: 50 ug via INTRAVENOUS
  Filled 2015-01-11 (×2): qty 25

## 2015-01-11 MED ORDER — FENTANYL 50 MCG/ML IV PCA SOLN
INTRAVENOUS | Status: DC
  Filled 2015-01-11: qty 25

## 2015-01-11 MED ORDER — HYDROCODONE-ACETAMINOPHEN 5-325 MG PO TABS
1.0000 | ORAL_TABLET | Freq: Four times a day (QID) | ORAL | Status: DC | PRN
  Administered 2015-01-11 – 2015-01-12 (×2): 1 via ORAL
  Filled 2015-01-11 (×2): qty 1

## 2015-01-11 MED ORDER — DIPHENHYDRAMINE HCL 12.5 MG/5ML PO ELIX
12.5000 mg | ORAL_SOLUTION | Freq: Four times a day (QID) | ORAL | Status: DC | PRN
  Administered 2015-01-11: 12.5 mg via ORAL
  Filled 2015-01-11: qty 10

## 2015-01-11 MED ORDER — HYDROMORPHONE HCL 1 MG/ML IJ SOLN
2.0000 mg | Freq: Once | INTRAMUSCULAR | Status: AC
  Administered 2015-01-11: 2 mg via INTRAVENOUS
  Filled 2015-01-11: qty 2

## 2015-01-11 MED ORDER — HYDROMORPHONE HCL 1 MG/ML IJ SOLN
1.0000 mg | Freq: Once | INTRAMUSCULAR | Status: AC
  Administered 2015-01-11: 1 mg via INTRAVENOUS
  Filled 2015-01-11: qty 1

## 2015-01-11 MED ORDER — FOLIC ACID 1 MG PO TABS
1.0000 mg | ORAL_TABLET | Freq: Every day | ORAL | Status: DC
  Administered 2015-01-12 – 2015-01-15 (×4): 1 mg via ORAL
  Filled 2015-01-11 (×5): qty 1

## 2015-01-11 MED ORDER — SENNOSIDES-DOCUSATE SODIUM 8.6-50 MG PO TABS
1.0000 | ORAL_TABLET | Freq: Two times a day (BID) | ORAL | Status: DC
  Administered 2015-01-12 – 2015-01-15 (×7): 1 via ORAL
  Filled 2015-01-11 (×14): qty 1

## 2015-01-11 MED ORDER — CETYLPYRIDINIUM CHLORIDE 0.05 % MT LIQD
7.0000 mL | Freq: Two times a day (BID) | OROMUCOSAL | Status: DC
  Administered 2015-01-12 – 2015-01-15 (×7): 7 mL via OROMUCOSAL

## 2015-01-11 MED ORDER — HYDROMORPHONE HCL 1 MG/ML IJ SOLN
1.0000 mg | Freq: Once | INTRAMUSCULAR | Status: AC
Start: 2015-01-11 — End: 2015-01-11
  Administered 2015-01-11: 1 mg via INTRAVENOUS
  Filled 2015-01-11: qty 1

## 2015-01-11 MED ORDER — ONDANSETRON HCL 4 MG/2ML IJ SOLN: 4.0000 mg | Freq: Four times a day (QID) | INTRAMUSCULAR | Status: DC | PRN

## 2015-01-11 MED ORDER — HYDROMORPHONE HCL 1 MG/ML IJ SOLN
1.0000 mg | INTRAMUSCULAR | Status: DC
  Administered 2015-01-11 (×2): 1 mg via INTRAVENOUS
  Filled 2015-01-11 (×2): qty 1

## 2015-01-11 MED ORDER — KETOROLAC TROMETHAMINE 15 MG/ML IJ SOLN
15.0000 mg | Freq: Four times a day (QID) | INTRAMUSCULAR | Status: DC
  Administered 2015-01-11 – 2015-01-16 (×17): 15 mg via INTRAVENOUS
  Filled 2015-01-11 (×20): qty 1

## 2015-01-11 MED ORDER — SODIUM CHLORIDE 0.9 % IV BOLUS (SEPSIS)
1000.0000 mL | Freq: Once | INTRAVENOUS | Status: AC
  Administered 2015-01-11: 1000 mL via INTRAVENOUS

## 2015-01-11 MED ORDER — DIPHENHYDRAMINE HCL 12.5 MG/5ML PO ELIX: 12.5000 mg | ORAL_SOLUTION | Freq: Four times a day (QID) | ORAL | Status: DC | PRN

## 2015-01-11 MED ORDER — SODIUM CHLORIDE 0.9 % IV SOLN
12.5000 mg | Freq: Four times a day (QID) | INTRAVENOUS | Status: DC | PRN
  Filled 2015-01-11: qty 0.25

## 2015-01-11 MED ORDER — ONDANSETRON HCL 4 MG/2ML IJ SOLN
4.0000 mg | INTRAMUSCULAR | Status: DC | PRN
  Administered 2015-01-12 – 2015-01-14 (×2): 4 mg via INTRAVENOUS
  Filled 2015-01-11 (×2): qty 2

## 2015-01-11 MED ORDER — FENTANYL 10 MCG/ML IV SOLN
INTRAVENOUS | Status: DC
  Filled 2015-01-11: qty 50

## 2015-01-11 MED ORDER — KETOROLAC TROMETHAMINE 30 MG/ML IJ SOLN
30.0000 mg | Freq: Once | INTRAMUSCULAR | Status: AC
  Administered 2015-01-11: 30 mg via INTRAVENOUS
  Filled 2015-01-11: qty 1

## 2015-01-11 MED ORDER — CHLORHEXIDINE GLUCONATE 0.12 % MT SOLN
15.0000 mL | Freq: Two times a day (BID) | OROMUCOSAL | Status: DC
  Administered 2015-01-12 – 2015-01-15 (×6): 15 mL via OROMUCOSAL
  Filled 2015-01-11 (×12): qty 15

## 2015-01-11 MED ORDER — DIPHENHYDRAMINE HCL 50 MG/ML IJ SOLN
25.0000 mg | Freq: Once | INTRAMUSCULAR | Status: AC
  Administered 2015-01-11: 25 mg via INTRAVENOUS
  Filled 2015-01-11: qty 1

## 2015-01-11 MED ORDER — DIPHENHYDRAMINE HCL 50 MG/ML IJ SOLN
12.5000 mg | Freq: Four times a day (QID) | INTRAMUSCULAR | Status: DC | PRN
  Administered 2015-01-11 – 2015-01-12 (×2): 12.5 mg via INTRAVENOUS
  Filled 2015-01-11 (×2): qty 1

## 2015-01-11 NOTE — H&P (Signed)
[facility] History and Physical                                                                                    Garrett Valdez, is a 24 y.o. male  MRN: [id number]   DOB - 09/01/1991  Admit Date - 01/11/2015  Outpatient Primary MD for the patient is Default, Provider, MD  Primary care provider is Dr. Lorayne MarekAdidas in [place], [place] Sickle cell doctor is Dr. Adah [facility]iter [facility]  With History of -  Past Medical History  Diagnosis Date  . Sickle cell anemia       History reviewed. No pertinent past surgical history.  in for   Chief Complaint  Patient presents with  . Sickle Cell Pain Crisis     HPI  Garrett ColtBrandon Valdez  is a 24 y.o. male,  who was diagnosed with sickle cell [place] disease at age 89. He receives care for his sickle [facility] at [facility].  He presents with a sickle cell pain crisis in his lower back that is making it difficult to walk.  The pain extends from his mid spine to his buttocks.  This severe pain started yesterday. The patient does not know of anything that triggered it. He reports having strep throat last week. He treated himself for strep with 500 mg of amoxicillin twice a day. He no longer has a sore throat.   He normally takes 250 g of amoxicillin twice a day as part of his sickle cell treatment regimen.  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  Review of Systems   In addition to the HPI above,  No Fever-chills, No Headache, No changes with Vision or hearing, No problems swallowing food or Liquids, No Chest pain, Cough or Shortness of Breath, No Abdominal pain, No Nausea or Vomiting, Bowel movements are regular, No Blood in stool or Urine, No dysuria, No new skin rashes or bruises, No new joints pains-aches,  No new weakness, tingling, numbness in any extremity, No recent weight gain or loss, A full 10 point Review of Systems was done, except as stated above, all other Review of Systems were negative.  Social  History History  Substance Use Topics  . Smoking status: Former Smoker -- 0.10 packs/day    Types: Cigarettes    Quit date: 12/13/2014  . Smokeless tobacco: Never Used  . Alcohol Use: No  Quit smoking 31 days ago.  Does not drink.  Used to use marijuana but gave it up 1 year ago.  Interestingly he had a positive drug test during his last admission in [facility] at [facility].  Family History He knows of no one in his family with active sickle cell, but both his brother and sister have the trait.    Prior to Admission medications   Medication Sig Start Date End Date Taking? Authorizing Provider  acetaminophen-codeine (TYLENOL #3) 300-30 MG per tablet Take 1 tablet by mouth every 4 (four) hours as needed for moderate pain.   Yes Historical Provider, MD  amoxicillin (AMOXIL) 250 MG capsule Take 250 mg by mouth 2 (two) times daily.   Yes Historical Provider, MD  doxepin (SINEQUAN) 10 MG capsule Take 10 mg by mouth at bedtime.   Yes Historical Provider, MD  folic acid (FOLVITE) 1 MG tablet Take 1 mg by mouth daily.   Yes Historical Provider, MD  gabapentin (NEURONTIN) 300 MG capsule Take 300 mg by mouth 3 (three) times daily.   Yes Historical Provider, MD  HYDROcodone-acetaminophen (NORCO/VICODIN) 5-325 MG per tablet Take 1 tablet by mouth every 6 (six) hours as needed for moderate pain.   Yes Historical Provider, MD  lidocaine (LIDODERM) 5 % Place 2 patches onto the skin daily. Remove & Discard patch within 12 hours or as directed by MD   Yes Historical Provider, MD  OxyCODONE (OXYCONTIN) 10 mg T12A 12 hr tablet Take 10 mg by mouth every 12 (twelve) hours.   Yes Historical Provider, MD    Allergies  Allergen Reactions  . Sulfur Shortness Of Breath and Itching  . Claritin [Loratadine] Other (See Comments)    "motor jerks, like it is going to throw him into a seizure"  . Morphine And Related Swelling    Large doses  . Vitamin B Complex [B Complex] Itching    Physical Exam  Vitals  Blood  pressure 131/87, pulse 83, temperature 97.9 F (36.6 C), temperature source Oral, resp. rate 19, SpO2 97 %.   General:  Thin, well developed pleasant male, lying in bed scratching his arms.  Psych:  Normal affect and insight, Not Suicidal or Homicidal, Awake Alert, Oriented X 3.  Neuro:   No F.N deficits, ALL C.Nerves Intact, Strength 5/5 all 4 extremities, Sensation intact all 4 extremities.  ENT:  Ears and Eyes appear Normal, Conjunctivae clear, PER. Moist oral mucosa without erythema or exudates.  Neck:  Supple, No lymphadenopathy appreciated  Respiratory:  Symmetrical chest wall movement, Good air movement bilaterally, CTAB.  Cardiac:  RRR, No Murmurs, no LE edema noted, no JVD.    Abdomen:  Positive bowel sounds, Soft, Non tender, Non distended,  No masses appreciated.  Mid and lower back are very tender even to mild palpation.  Extremities:  Able to move all 4. 5/5 strength in each,  no effusions.  Data Review  CBC  Recent Labs Lab 01/11/15 0900  WBC 8.6  HGB 12.9*  HCT 35.1*  PLT 401*  MCV 78.9  MCH 29.0  MCHC 36.8*  RDW 16.7*  LYMPHSABS 1.7  MONOABS 1.0  EOSABS 0.3  BASOSABS 0.1    Chemistries   Recent Labs Lab 01/11/15 0900  NA 138  K 3.9  CL 108  CO2 25  GLUCOSE 82  BUN 6  CREATININE 0.84  CALCIUM 8.8  AST 24  ALT 10  ALKPHOS 79  BILITOT 1.5*     Urinalysis    Component Value Date/Time   COLORURINE YELLOW 09/02/2012 1411   APPEARANCEUR CLEAR 09/02/2012 1411   LABSPEC 1.009 09/02/2012 1411   PHURINE 6.0 09/02/2012 1411   GLUCOSEU NEGATIVE 09/02/2012 1411   HGBUR NEGATIVE 09/02/2012 1411   BILIRUBINUR NEGATIVE 09/02/2012 1411   KETONESUR NEGATIVE 09/02/2012 1411   PROTEINUR NEGATIVE 09/02/2012 1411   UROBILINOGEN 1.0 09/02/2012 1411   NITRITE NEGATIVE 09/02/2012 1411   LEUKOCYTESUR NEGATIVE 09/02/2012 1411    Imaging results:   No results found.   Assessment & Plan  Principal Problem:   Sickle cell pain crisis  Sickle  cell pain crisis Pain primarily in lower back.   At [facility] he has been treated with a Fentanyl PCA pump.  I have asked pharmacy to obtain the  dosages and order customized PCA. He will admitted using the sickle cell order set and transferred to [facility].   IVF, Oxygen, Pain Control, continue amoxicillin at 250 mg bid.  DVT Prophylaxis: Lovenox   AM Labs Ordered, also please review Full Orders   Family Communication:   Spoke with the patient.   Left voice mail for his mother.  Code Status:  Full code  Condition:  Guarded but stable.  Time spent in minutes : [address]  PA-C on 01/11/2015 at 12:38 PM  Between 7am to 7pm - Pager - [phone]  After 7pm go to [email or web address] - password [email or web address]  And look for the night coverage person covering me after hours  [facility]

## 2015-01-11 NOTE — ED Provider Notes (Signed)
CSN: [id number]     Arrival date & time 01/11/15  [id number] History   First MD Initiated Contact with Patient 01/11/15 [phone]     No chief complaint on file.    (Consider location/radiation/quality/duration/timing/severity/associated sxs/prior Treatment) Patient is a 24 y.o. male presenting with sickle cell pain.  Sickle Cell Pain Crisis Location:  Back Severity:  Severe Onset quality:  Gradual Duration:  1 day Similar to previous crisis episodes: yes   Timing:  Constant Progression:  Worsening Chronicity:  New Sickle cell genotype:  [place] Usual hemoglobin level:  11 History of pulmonary emboli: no   Context: cold exposure   Context: not change in medication   Context comment:  Diagnosed with viral URI versus mild strep pharyngitis on 2/26, now improving Relieved by:  Nothing Worsened by:  Movement Ineffective treatments:  Folic acid, hydroxyurea and prescription drugs Associated symptoms: sore throat (Improving)   Associated symptoms: no chest pain, no congestion, no cough, no fatigue, no fever, no headaches, no nausea, no priapism, no shortness of breath, no vomiting and no wheezing   Risk factors: frequent pain crises     Past Medical History  Diagnosis Date  . Sickle cell anemia    No past surgical history on file. No family history on file. History  Substance Use Topics  . Smoking status: Current Every Day Smoker -- 0.10 packs/day    Types: Cigarettes  . Smokeless tobacco: Never Used  . Alcohol Use: No    Review of Systems  Constitutional: Negative for fever, chills and fatigue.  HENT: Positive for sore throat (Improving). Negative for congestion, rhinorrhea, trouble swallowing and voice change.   Eyes: Negative for visual disturbance.  Respiratory: Negative for cough, shortness of breath and wheezing.   Cardiovascular: Negative for chest pain and leg swelling.  Gastrointestinal: Negative for nausea, vomiting, abdominal pain and diarrhea.  Genitourinary: Negative for  dysuria.  Musculoskeletal: Positive for back pain and gait problem (Due to pain). Negative for neck pain and neck stiffness.  Skin: Negative for rash.  Neurological: Negative for dizziness, syncope and headaches.      Allergies  Sulfur; Claritin; Morphine and related; and Vitamin b complex  Home Medications   Prior to Admission medications   Medication Sig Start Date End Date Taking? Authorizing Provider  amoxicillin (AMOXIL) 500 MG capsule Take 250 mg by mouth 2 (two) times daily.    Historical Provider, MD   BP 144/90 mmHg  Temp(Src) 97.9 F (36.6 C) (Oral)  Resp 19 Physical Exam  Constitutional: He is oriented to person, place, and time. He appears well-developed and well-nourished. He appears distressed (Appears uncomfortable due to pain).  HENT:  Head: Normocephalic and atraumatic.  Mouth/Throat: Oropharynx is clear and moist.  Mild posterior pharyngeal erythema. No significant tonsillar swelling is noted but there are exudates bilaterally. No peritonsillar swelling. Uvula is midline  Eyes: Conjunctivae are normal. Pupils are equal, round, and reactive to light.  Neck: Normal range of motion. Neck supple.  No significant for tender lymphadenopathy  Cardiovascular: Normal rate, normal heart sounds and intact distal pulses.  Exam reveals no friction rub.   No murmur heard. Pulmonary/Chest: Effort normal and breath sounds normal. No respiratory distress. He has no wheezes. He has no rales.  Abdominal: Soft. Bowel sounds are normal. He exhibits no distension. There is no tenderness.  Musculoskeletal: He exhibits no edema.  Mild diffuse paraspinal greater than midline tenderness to palpation throughout the lower thoracic and upper lumbar spine. No step-offs or deformity.  Neurological: He is alert and oriented to person, place, and time.  Skin: Skin is warm. No rash noted.  Nursing note and vitals reviewed.   ED Course  Procedures (including critical care time) Labs  Review Labs Reviewed  CBC WITH DIFFERENTIAL/PLATELET - Abnormal; Notable for the following:    Hemoglobin 12.9 (*)    HCT 35.1 (*)    MCHC 36.8 (*)    RDW 16.7 (*)    Platelets 401 (*)    All other components within normal limits  COMPREHENSIVE METABOLIC PANEL - Abnormal; Notable for the following:    Total Bilirubin 1.5 (*)    All other components within normal limits  RETICULOCYTES - Abnormal; Notable for the following:    Retic Ct Pct 4.4 (*)    Retic Count, Manual 195.8 (*)    All other components within normal limits  URINALYSIS, ROUTINE W REFLEX MICROSCOPIC - Abnormal; Notable for the following:    APPearance HAZY (*)    All other components within normal limits    Imaging Review No results found.   EKG Interpretation None      MDM   24 yo M with PMHx of sickle cell [place] disease who presents with a 1-day history of lower back pain similar to his previous pain crises. No fevers. See HPI above. On arrival, T 39F, HR 81, RR 16, BP 115/72, satting 99% on RA. Exam as above, pt is overall well-appearing, non-toxic, but in moderate distress 2/2 pain with diffuse paraspinal > midline lower T&L spine tenderness.   Pt's presentation is most c/w acute sickle cell pain crisis. No red flag sx. Pt did have recent likely viral URI (strep cx + for GBS) which may be precipitating his crisis. However, no fever, chills, malaise, or s/s systemic infection and pt is afebrile without tachycardia. No CP, SOB, cough, sputum production, wheezing, and lungs are CTAB, satting well on RA, with normal RR - do not suspect acute chest. No abdominal pain, nausea, vomiting, diarrhea, or signs of bowel ischemia or infarct. No neuro deficits. No focal bony tenderness to suggest bony abnormality or osteo. Pt o/w well-appearing. Will treat with IVF, analgesia, and labs.  Labs reviewed as above. CBC with no leukocytosis. Hgb 12.9, just above baseline. Platelets 401. CMP unremarkable. Retic ct 4.4, all reassuring.  Pain improving after first dose of dilaudid. Will give additional dose with toradol. Pt improved on exam, wants to eat and drink.  Pain remains 8/10 despite 3 rounds of IV analgesia. Will admit to medicine. Discussed with [facility] and patient accepted to [facility] with Heme-Onc Team. Ordered have been placed and patient is otherwise stable. EMTALA forms completed. Patient expresses understanding of reason for transfer and is in agreement. Patient stabilized and exam/labwork completed.  Clinical Impression: 1. Sickle cell pain crisis     Disposition: Admit  Condition: Stable  Pt seen in conjunction with Dr. Hilarie Fredrickson, MD 01/11/15 1543  Shaune Pollack, MD 01/11/15 1731  Richardean Canal, MD 01/12/15 [phone]

## 2015-01-11 NOTE — ED Notes (Signed)
FENTANYL PCA PUMP NOT STARTED D/T DID NOT RECEIVE PUMP.

## 2015-01-11 NOTE — ED Notes (Addendum)
Throat culture: Strep beta hemolytic not group A.  Pt. adequately treated with Amoxicillin.  I called pt. and left a message to call. Call 1. Vassie MoselleYork, Alferd Obryant M 01/11/2015 Unable to leave a message on pt.'s phone.  Called contact and left a message for pt. to call. Call 2. 01/13/2015 I called father back and he said he has not been able to contact him because he is in [facility].  I thanked him and told him I would try there.  Left message with person that answered the phone, for pt. to call.  Call 3. 01/14/2015 Unable to reach pt. By phone.  Confidential marked letter sent to [facility]. 01/23/2015

## 2015-01-11 NOTE — ED Notes (Signed)
[facility] arrived to transport pt.  

## 2015-01-11 NOTE — ED Notes (Signed)
Attempted to call report. Secretary advised RN will return call. [facility] aware of need for transport.

## 2015-01-11 NOTE — ED Notes (Signed)
Pt in from home c/o lower bil back pain onset yesterday, pt hx of sickle cell crisis, last crisis 9/15, [facility], denies injury to the area, denies Bowel & bladder incontinence, pt ambulatory with pain, no swelling noted to the area, pt denies SOB, CP, & N/V/D

## 2015-01-11 NOTE — ED Notes (Signed)
Diet tray ordered for pt. Garrett Valdez, Consulting civil engineerCharge RN, attempting to obtain PCA pump. Fentanyl syringe received from pharmacy.

## 2015-01-11 NOTE — ED Notes (Signed)
FENTANYL SYRINGE RETURNED TO [facility], PHARMACIST.

## 2015-01-11 NOTE — Progress Notes (Signed)
Patient arrived to unit at about 1600. Alert and oriented. Able to verbalize needs. Patient ambulatory to bathroom. Expressed pain 7/10. Fent. PCA set up, patient educated on PCA use, lock out, O2 and ETOH monitors. Patient verbalized understanding. Patient oriented to unit, call light. No questions at this time.

## 2015-01-12 LAB — COMPREHENSIVE METABOLIC PANEL
ALT: 12 U/L (ref 0–53)
AST: 28 U/L (ref 0–37)
Albumin: 4 g/dL (ref 3.5–5.2)
Alkaline Phosphatase: 76 U/L (ref 39–117)
Anion gap: 6 (ref 5–15)
BILIRUBIN TOTAL: 1.5 mg/dL — AB (ref 0.3–1.2)
BUN: 7 mg/dL (ref 6–23)
CO2: 30 mmol/L (ref 19–32)
CREATININE: 0.74 mg/dL (ref 0.50–1.35)
Calcium: 9 mg/dL (ref 8.4–10.5)
Chloride: 104 mmol/L (ref 96–112)
GFR calc Af Amer: >90 mL/min (ref >90)
Glucose, Bld: 82 mg/dL (ref 70–99)
Potassium: 4.2 mmol/L (ref 3.5–5.1)
Sodium: 140 mmol/L (ref 135–145)
Total Protein: 6.8 g/dL (ref 6.0–8.3)

## 2015-01-12 LAB — CBC
HEMATOCRIT: 35.4 % — AB (ref 39.0–52.0)
HEMOGLOBIN: 12.3 g/dL — AB (ref 13.0–17.0)
MCH: 27.5 pg (ref 26.0–34.0)
MCHC: 34.7 g/dL (ref 30.0–36.0)
MCV: 79.2 fL (ref 78.0–100.0)
Platelets: 399 10*3/uL (ref 150–400)
RBC: 4.47 MIL/uL (ref 4.22–5.81)
RDW: 16.9 % — ABNORMAL HIGH (ref 11.5–15.5)
WBC: 9.8 10*3/uL (ref 4.0–10.5)

## 2015-01-12 MED ORDER — HYDROMORPHONE 2 MG/ML HIGH CONCENTRATION IV PCA SOLN: INTRAVENOUS | Status: DC

## 2015-01-12 MED ORDER — HYDROMORPHONE HCL 1 MG/ML IJ SOLN
1.0000 mg | INTRAMUSCULAR | Status: AC
Start: 2015-01-12 — End: 2015-01-12
  Administered 2015-01-12: 1 mg via INTRAVENOUS

## 2015-01-12 MED ORDER — ONDANSETRON HCL 4 MG/2ML IJ SOLN: 4.0000 mg | Freq: Four times a day (QID) | INTRAMUSCULAR | Status: DC | PRN

## 2015-01-12 MED ORDER — DIPHENHYDRAMINE HCL 25 MG PO CAPS
25.0000 mg | ORAL_CAPSULE | Freq: Four times a day (QID) | ORAL | Status: DC | PRN
  Administered 2015-01-13 (×2): 25 mg via ORAL
  Filled 2015-01-12 (×2): qty 1

## 2015-01-12 MED ORDER — CEFDINIR 125 MG/5ML PO SUSR
300.0000 mg | Freq: Two times a day (BID) | ORAL | Status: DC
  Administered 2015-01-12 – 2015-01-15 (×7): 300 mg via ORAL
  Filled 2015-01-12 (×13): qty 15

## 2015-01-12 MED ORDER — NALOXONE HCL 0.4 MG/ML IJ SOLN: 0.4000 mg | INTRAMUSCULAR | Status: DC | PRN

## 2015-01-12 MED ORDER — HYDROMORPHONE 2 MG/ML HIGH CONCENTRATION IV PCA SOLN
INTRAVENOUS | Status: DC
  Administered 2015-01-12: 3.6 mg via INTRAVENOUS
  Administered 2015-01-12 (×2): 2 mg via INTRAVENOUS
  Administered 2015-01-13: 7.4 mg via INTRAVENOUS
  Administered 2015-01-13: 1.8 mg via INTRAVENOUS
  Administered 2015-01-13: 2 mg via INTRAVENOUS
  Administered 2015-01-13: 11.4 mg via INTRAVENOUS
  Administered 2015-01-13: 2 mg via INTRAVENOUS
  Administered 2015-01-13: 1.8 mg via INTRAVENOUS
  Administered 2015-01-13: via INTRAVENOUS
  Administered 2015-01-13: 2 mg via INTRAVENOUS
  Administered 2015-01-14: 6.6 mg via INTRAVENOUS
  Administered 2015-01-14: 3 mg via INTRAVENOUS
  Administered 2015-01-14 (×2): 6 mg via INTRAVENOUS
  Administered 2015-01-15: 9 mg via INTRAVENOUS
  Administered 2015-01-15: 7.1 mg via INTRAVENOUS
  Administered 2015-01-15: 11:00:00 via INTRAVENOUS
  Administered 2015-01-15: 13.2 mg via INTRAVENOUS
  Administered 2015-01-15: 4.2 mg via INTRAVENOUS
  Administered 2015-01-16: 3.6 mg via INTRAVENOUS
  Administered 2015-01-16: 7.8 mg via INTRAVENOUS
  Administered 2015-01-16: 4.2 mg via INTRAVENOUS
  Filled 2015-01-12 (×3): qty 25

## 2015-01-12 MED ORDER — HYDROMORPHONE HCL 1 MG/ML IJ SOLN
INTRAMUSCULAR | Status: AC
  Filled 2015-01-12: qty 1

## 2015-01-12 MED ORDER — SODIUM CHLORIDE 0.9 % IJ SOLN: 9.0000 mL | INTRAMUSCULAR | Status: DC | PRN

## 2015-01-12 MED ORDER — DIPHENHYDRAMINE HCL 50 MG/ML IJ SOLN
25.0000 mg | Freq: Once | INTRAMUSCULAR | Status: AC
Start: 2015-01-12 — End: 2015-01-12
  Administered 2015-01-12: 25 mg via INTRAVENOUS
  Filled 2015-01-12: qty 1

## 2015-01-12 NOTE — Progress Notes (Signed)
SICKLE CELL SERVICE PROGRESS NOTE  Garrett Valdez ZOX:[id number] DOB: 01-18-1991 DOA: 01/11/2015 PCP: Constance Holster, MD  Assessment/Plan: Principal Problem:   Sickle cell pain crisis  1. Hb [place] with crisis: This gentleman is unknown to me and a review of his records from acute admissions to [facility] center verify a diagnosis of Hb [place]. Per PA Algis Downs who admitted the patient, he is unable to tolerate Dilaudid and requires Fentanyl. A review ofhis records at [facility] from 12/07/2014 confirm this. However this morning he states that the Fentanyl is ineffective and reports that he has no allergy to Diludid and that actually Dilaudid is more effective. He has requested that the medication be changed to Dilaudid. I will change to Dilaudid PCA. Continue OxyContin, Toradol, Neurontin and IVF.  2. Chronic Pain: Pt reports that he uses OxyContin only infrequently. And goes many days without need for opiate analgesics.  3. Anemia: Hb stable. 4. Psychosocial: Pt is concerned about who will write his analgesic prescriptions at the time of discharge. He reported that he receives his care at [facility], however I called [facility] and they are not familiar with the patient. Pt requested transfer to [facility] to "His Physician": Dr. Adah Salvage so that he can get prescriptions when he is discharged from the hospital,  however Dr. Adah Salvage is a Hospitalist who is not working today. There is no EMTALA  To transfer to [facility]. I have advised patient that he needs to establish care with a Physician in the Community to affect continuity of care. He then reported that he has a PMD in [place] a Dr. Renaldo Harrison who writes his prescriptions. I spoke with Dr,. Addis who reports that he has seen this patietn only on 2 occasions once in 2013 and then again in 2015 on both occasions for acute care visits. He denies chronic care of this patient.  5. Medication non-compliance: Pt reports that he takes Hydrea but this is not reflected in the MCV.  Furthermore Dr. Hart Robinsons denies prescribing these medications for the patient. Will continue Folic Acid. Pt also had safety sensor disconnected from the PCA pump. I asked the Pharmacist and International aid/development worker to speak with patient about safety measures as it relates to the use of the PCA. 6. Antibiotic Use: Pt reports that he was treated for sore throat with Amoxicillin. A review of records from the ED at [facility] shows that treatment was initiated on 01/07/2015 with Omnicef. I will continue Omnicef until 01/16/2015.    Code Status: Full Code Family Communication: N/A Disposition Plan: Not yet ready for discharge  MATTHEWS,MICHELLE A.  Pager [phone]. If 7PM-7AM, please contact night-coverage.  01/12/2015, 7:09 PM  LOS: 1 day   Consultants:  None  Procedures:  None  Antibiotics:  Amoxcillin 3/1>>3/2  Omnicef 3/2 >>  HPI/Subjective: Pt states that pain very poorly controlled with Fentanyl and that he got no sleep last night because of pain. He is up walking around in the room. He is focued on his pain and is frustrated by questions about anything else.  Objective:  01/12/15 1134 01/12/15 1442 01/12/15 1500  BP:   133/53  Pulse:   72  Temp:   98 F (36.7 C)  TempSrc:   Oral  Resp: Weight:     SpO2: 99% 94% 95%   Weight change:   Intake/Output Summary (Last 24 hours) at 01/12/15 1909 Last data filed at 01/12/15 1803  Gross per 24 hour  Intake  0 ml  Output    400 ml  Net   -400 ml    General: Alert, awake, oriented x3, in moderate distress secondary to.  HEENT: [place]/AT PEERL, EOMI, anicteric Neck: Trachea midline,  no masses, no thyromegal,y no JVD, no carotid bruit OROPHARYNX:  Moist, No exudate/ erythema/lesions.  Heart: Regular rate and rhythm, without murmurs, rubs, gallops, PMI non-displaced, no heaves or thrills on palpation.  Lungs: Clear to auscultation, no wheezing or rhonchi noted. No increased vocal fremitus resonant to percussion  Abdomen: Soft,  nontender, nondistended, positive bowel sounds, no masses no hepatosplenomegaly noted.  Neuro: No focal neurological deficits noted cranial nerves II through XII grossly intact.  Strength normal in bilateral upper and lower extremities. Musculoskeletal: No warm swelling or erythema around joints, no spinal tenderness noted. Psychiatric: Patient alert and oriented x3, good insight and cognition, good recent to remote recall.    Data Reviewed: Basic Metabolic Panel:  Recent Labs Lab 01/11/15 0900 01/11/15 1617 01/12/15 0520  NA 138 139 140  K 3.9 4.0 4.2  CL 108 108 104  CO2 25 28 30   GLUCOSE 82 116* 82  BUN 6 6 7   CREATININE 0.84 0.70 0.74  CALCIUM 8.8 8.7 9.0  MG  --  2.0  --    Liver Function Tests:  Recent Labs Lab 01/11/15 0900 01/11/15 1617 01/12/15 0520  AST 24 22 28   ALT 10 11 12   ALKPHOS 79 74 76  BILITOT 1.5* 1.2 1.5*  PROT 7.1 6.9 6.8  ALBUMIN 4.2 4.2 4.0   No results for input(s): LIPASE, AMYLASE in the last 168 hours. No results for input(s): AMMONIA in the last 168 hours. CBC:  Recent Labs Lab 01/11/15 0900 01/12/15 0520  WBC 8.6 9.8  NEUTROABS 5.5  --   HGB 12.9* 12.3*  HCT 35.1* 35.4*  MCV 78.9 79.2  PLT 401* 399   Cardiac Enzymes: No results for input(s): CKTOTAL, CKMB, CKMBINDEX, TROPONINI in the last 168 hours. BNP (last 3 results) No results for input(s): BNP in the last 8760 hours.  ProBNP (last 3 results) No results for input(s): PROBNP in the last 8760 hours.  CBG: No results for input(s): GLUCAP in the last 168 hours.  Recent Results (from the past 240 hour(s))  Culture, Group A Strep     Status: Abnormal   Collection Time: 01/07/15 12:00 PM  Result Value Ref Range Status   Strep A Culture Comment (A)  Final    Comment: (NOTE) Beta-hemolytic colonies, not group A Streptococcus isolated. Performed At: [facility] [address] [place], [place] [id number] Mila HomerHancock William F MD WU:[id number]:[phone]      Studies: No  results found.  Scheduled Meds: . amoxicillin  250 mg Oral BID  . antiseptic oral rinse  7 mL Mouth Rinse q12n4p  . chlorhexidine  15 mL Mouth Rinse BID  . doxepin  10 mg Oral QHS  . enoxaparin (LOVENOX) injection  40 mg Subcutaneous Q24H  . folic acid  1 mg Oral Daily  . gabapentin  300 mg Oral TID  . HYDROmorphone PCA 2 mg/mL   Intravenous 6 times per day  . ketorolac  15 mg Intravenous 4 times per day  . lidocaine  2 patch Transdermal Q24H  . OxyCODONE  10 mg Oral Q12H  . senna-docusate  1 tablet Oral BID   Continuous Infusions: . sodium chloride 150 mL/hr at 01/11/15 1411    Tme spent 75 minutes

## 2015-01-12 NOTE — Progress Notes (Signed)
17ml of 50 mcg/ml high concentration  Fentanyl PCA wis with Claudia PollockNakiya Dumas, RN.

## 2015-01-12 NOTE — Progress Notes (Signed)
Patient alert and oriented. Able to verbalize needs. Complaints of pain 6/10 in lower back. Patient encouraged to use PCA. Unable to see  PCA use for last 12-24hr. PCA pump cut off and had to be reprogrammed. Will continue to monitor. Patient currently sitting up in bed having conversation with staff.

## 2015-01-12 NOTE — Progress Notes (Signed)
Patient assisted to bathroom by tech and asked for a few minutes to move his bowels. Tech checked on patient to be sure he was ok. Patient was still on the toilet at that time. Nurse Tech went to check on patient again and patient was found sitting in floor. Patient states he tried to get up and fell to his knees. Patient c/o bilateral knee pain. No obvious signs of injury. Will continue to monitor.

## 2015-01-12 NOTE — Progress Notes (Signed)
ANTIBIOTIC CONSULT NOTE - INITIAL  Pharmacy Consult for Cefdinir Indication: suspected Strep pharyngitis  Allergies  Allergen Reactions  . Sulfur Shortness Of Breath and Itching  . Claritin [Loratadine] Other (See Comments)    "motor jerks, like it is going to throw him into a seizure"  . Morphine And Related Swelling    Large doses  . Vitamin B Complex [B Complex] Itching    Patient Measurements: Weight: 125 lb (56.7 kg)  Vital Signs: Temp: 99.1 F (37.3 C) (03/02 1802) Temp Source: Oral (03/02 1802) BP: 135/113 mmHg (03/02 1802) Pulse Rate: 89 (03/02 1802) Intake/Output from previous day: 03/01 0701 - 03/02 0700 In: -  Out: 200 [Urine:200] Intake/Output from this shift:    Labs:  Recent Labs  01/11/15 0900 01/11/15 1617 01/12/15 0520  WBC 8.6  --  9.8  HGB 12.9*  --  12.3*  PLT 401*  --  399  CREATININE 0.84 0.70 0.74   CrCl cannot be calculated (Unknown ideal weight.). No results for input(s): VANCOTROUGH, VANCOPEAK, VANCORANDOM, GENTTROUGH, GENTPEAK, GENTRANDOM, TOBRATROUGH, TOBRAPEAK, TOBRARND, AMIKACINPEAK, AMIKACINTROU, AMIKACIN in the last 72 hours.   Microbiology: Recent Results (from the past 720 hour(s))  Culture, Group A Strep     Status: Abnormal   Collection Time: 01/07/15 12:00 PM  Result Value Ref Range Status   Strep A Culture Comment (A)  Final    Comment: (NOTE) Beta-hemolytic colonies, not group A Streptococcus isolated. Performed At: [facility] [address] [place], [place] [id number] Mila HomerHancock William F MD WU:[id number]:[phone]     Medical History: Past Medical History  Diagnosis Date  . Sickle cell anemia     Medications:  Anti-infectives    Start     Dose/Rate Route Frequency Ordered Stop   01/12/15 2200  cefdinir (OMNICEF) 125 MG/5ML suspension 300 mg     300 mg Oral 2 times daily 01/12/15 1958     01/11/15 2200  amoxicillin (AMOXIL) capsule 250 mg  Status:  Discontinued     250 mg Oral 2 times daily 01/11/15 1557  01/12/15 1935     Assessment: Garrett Valdez admitted with Sickle Cell pain crisis. Pharmacy is asked to dose Cefdinir for suspected Strep pharyngitis.   Goal of Therapy:  Appropriate antibiotic dosing for renal function; eradication of infection  Plan:  Cefdinir 300mg  PO bid. Dose-adjustments are not likely to be needed so pharmacy will follow in the background.  Charolotte Ekeom Anival Pasha, PharmD, pager [phone]. 01/12/2015,8:03 PM.

## 2015-01-13 NOTE — Progress Notes (Signed)
Subjective: A 24 yo patient admitted with sickle cell painful crisis. Patient has been on Dilaudid PCA, Toradol as well as IVF. He is tolerating the Dilaudid without problems and has used 42 mg in 24 hours with 68 demands and 61 deliveries. No SOB, No cough. He has fallen yesterday and complains of pain in his tail bone. He has not gotten out of bed at all. Has  Lidocaine patch in place also.  Objective: Vital signs in last 24 hours: Temp:  [98 F (36.7 C)-98.7 F (37.1 C)] 98.7 F (37.1 C) (03/03 1719) Pulse Rate:  [67-105] 76 (03/03 1719) Resp:  [7-18] 12 (03/03 1945) BP: (122-151)/(83-108) 130/83 mmHg (03/03 1719) SpO2:  [95 %-100 %] 96 % (03/03 1719) Weight change:  Last BM Date: 01/13/15  Intake/Output from previous day: 03/02 0701 - 03/03 0700 In: -  Out: 2550 [Urine:2550] Intake/Output this shift:    General appearance: alert, cooperative and no distress Eyes: conjunctivae/corneas clear. PERRL, EOM's intact. Fundi benign. Throat: lips, mucosa, and tongue normal; teeth and gums normal Neck: no adenopathy, no carotid bruit, no JVD, supple, symmetrical, trachea midline and thyroid not enlarged, symmetric, no tenderness/mass/nodules Back: symmetric, no curvature. ROM normal. No CVA tenderness. Resp: clear to auscultation bilaterally Chest wall: no tenderness Cardio: regular rate and rhythm, S1, S2 normal, no murmur, click, rub or gallop GI: soft, non-tender; bowel sounds normal; no masses,  no organomegaly Extremities: extremities normal, atraumatic, no cyanosis or edema Pulses: 2+ and symmetric Skin: Skin color, texture, turgor normal. No rashes or lesions Neurologic: Grossly normal  Lab Results:  Recent Labs  01/11/15 0900 01/12/15 0520  WBC 8.6 9.8  HGB 12.9* 12.3*  HCT 35.1* 35.4*  PLT 401* 399   BMET  Recent Labs  01/11/15 1617 01/12/15 0520  NA 139 140  K 4.0 4.2  CL 108 104  CO2 28 30  GLUCOSE 116* 82  BUN 6 7  CREATININE 0.70 0.74  CALCIUM 8.7  9.0    Studies/Results: No results found.  Medications: I have reviewed the patient's current medications.  Assessment/Plan: A 24 yo man admitted with Sickle cell painful crisis and acute Pharyngitis.  #1. Sickle Cell Painful Crisis: Patient is doing better. He wants to continue with oral Oxycodone. [place] Will continue oral medications with his PCA.  #2. Sickle Cell Anemia: H/H stable  #3 Pharyngitis: On Cefdinir.   #4. S/P Fall: will get PT/OT evaluation.   LOS: 2 days   Rosealynn Mateus,[name] 01/13/2015, 8:29 PM

## 2015-01-14 LAB — CBC WITH DIFFERENTIAL/PLATELET
BASOS ABS: 0.1 10*3/uL (ref 0.0–0.1)
Basophils Relative: 1 % (ref 0–1)
Eosinophils Absolute: 1.4 10*3/uL — ABNORMAL HIGH (ref 0.0–0.7)
Eosinophils Relative: 13 % — ABNORMAL HIGH (ref 0–5)
HCT: 33 % — ABNORMAL LOW (ref 39.0–52.0)
Hemoglobin: 11.8 g/dL — ABNORMAL LOW (ref 13.0–17.0)
LYMPHS ABS: 2.9 10*3/uL (ref 0.7–4.0)
LYMPHS PCT: 28 % (ref 12–46)
MCH: 28.1 pg (ref 26.0–34.0)
MCHC: 35.8 g/dL (ref 30.0–36.0)
MCV: 78.6 fL (ref 78.0–100.0)
MONOS PCT: 21 % — AB (ref 3–12)
Monocytes Absolute: 2.2 10*3/uL — ABNORMAL HIGH (ref 0.1–1.0)
Neutro Abs: 3.9 10*3/uL (ref 1.7–7.7)
Neutrophils Relative %: 37 % — ABNORMAL LOW (ref 43–77)
PLATELETS: 321 10*3/uL (ref 150–400)
RBC: 4.2 MIL/uL — ABNORMAL LOW (ref 4.22–5.81)
RDW: 16.8 % — ABNORMAL HIGH (ref 11.5–15.5)
WBC: 10.5 10*3/uL (ref 4.0–10.5)

## 2015-01-14 LAB — COMPREHENSIVE METABOLIC PANEL
ALK PHOS: 85 U/L (ref 39–117)
ALT: 17 U/L (ref 0–53)
ANION GAP: 8 (ref 5–15)
AST: 49 U/L — ABNORMAL HIGH (ref 0–37)
Albumin: 4.3 g/dL (ref 3.5–5.2)
BUN: 10 mg/dL (ref 6–23)
CHLORIDE: 99 mmol/L (ref 96–112)
CO2: 32 mmol/L (ref 19–32)
Calcium: 9.4 mg/dL (ref 8.4–10.5)
Creatinine, Ser: 0.71 mg/dL (ref 0.50–1.35)
GFR calc Af Amer: >90 mL/min (ref >90)
GFR calc non Af Amer: >90 mL/min (ref >90)
GLUCOSE: 106 mg/dL — AB (ref 70–99)
Potassium: 4.4 mmol/L (ref 3.5–5.1)
Sodium: 139 mmol/L (ref 135–145)
Total Bilirubin: 2 mg/dL — ABNORMAL HIGH (ref 0.3–1.2)
Total Protein: 7.4 g/dL (ref 6.0–8.3)

## 2015-01-14 MED ORDER — OXYCODONE HCL 5 MG PO TABS
5.0000 mg | ORAL_TABLET | ORAL | Status: DC | PRN
  Administered 2015-01-14 – 2015-01-16 (×8): 5 mg via ORAL
  Filled 2015-01-14 (×9): qty 1

## 2015-01-14 NOTE — Progress Notes (Signed)
Pt nasal cannula was out.  Informed pt of importance of keeping the Nasal Canula on.  Erick Blinksuchman, [place] Mikami D, RN

## 2015-01-14 NOTE — Progress Notes (Signed)
2ml of Dilaudid High Concentration PCA wasted in the sink, witnessed by Clarita Cranehelsea Young,RN.

## 2015-01-14 NOTE — Progress Notes (Signed)
Pt asleep in room with O2 cannula out of his nose and O2 sats are reading in the 80s. Pt was asked to place cannula back in nose and pt states "Damn, I can never get any sleep." Pt placed cannula back in the nose at 2L and the O2 sats began to range from the 90s-100.

## 2015-01-14 NOTE — Progress Notes (Signed)
Subjective: Patient is complaining of 7 out of 10 pain. Mainly knees and bilateral legs. He has been able to get out of bed with physical therapy. He has been having problem with the PCA going off while he is sleeping. He has used Dilaudid PCA 38.5 mg with 65 demands and 64 deliveries. Denied any shortness of breath or cough.  Objective: Vital signs in last 24 hours: Temp:  [98.2 F (36.8 C)-98.7 F (37.1 C)] 98.5 F (36.9 C) (03/04 0949) Pulse Rate:  [73-100] 79 (03/04 0949) Resp:  [10-22] 12 (03/04 0949) BP: (130-159)/(73-89) 149/73 mmHg (03/04 0949) SpO2:  [95 %-100 %] 97 % (03/04 0949) Weight change:  Last BM Date: 01/13/15  Intake/Output from previous day: 03/03 0701 - 03/04 0700 In: 2177.5 [P.O.:240; I.V.:1937.5] Out: 525 [Urine:525] Intake/Output this shift: Total I/O In: -  Out: 400 [Urine:400]  General appearance: alert, cooperative and no distress Eyes: conjunctivae/corneas clear. PERRL, EOM's intact. Fundi benign. Throat: lips, mucosa, and tongue normal; teeth and gums normal Neck: no adenopathy, no carotid bruit, no JVD, supple, symmetrical, trachea midline and thyroid not enlarged, symmetric, no tenderness/mass/nodules Back: symmetric, no curvature. ROM normal. No CVA tenderness. Resp: clear to auscultation bilaterally Chest wall: no tenderness Cardio: regular rate and rhythm, S1, S2 normal, no murmur, click, rub or gallop GI: soft, non-tender; bowel sounds normal; no masses,  no organomegaly Extremities: extremities normal, atraumatic, no cyanosis or edema Pulses: 2+ and symmetric Skin: Skin color, texture, turgor normal. No rashes or lesions Neurologic: Grossly normal  Lab Results:  Recent Labs  01/12/15 0520 01/14/15 0520  WBC 9.8 10.5  HGB 12.3* 11.8*  HCT 35.4* 33.0*  PLT 399 321   BMET  Recent Labs  01/12/15 0520 01/14/15 0520  NA 140 139  K 4.2 4.4  CL 104 99  CO2 30 32  GLUCOSE 82 106*  BUN 7 10  CREATININE 0.74 0.71  CALCIUM 9.0 9.4     Studies/Results: No results found.  Medications: I have reviewed the patient's current medications.  Assessment/Plan: A 24 yo man admitted with Sickle cell painful crisis and acute Pharyngitis.  #1. Sickle Cell Painful Crisis: Patient is doing better. Will continue oral medications with his PCA. We will continue the oxycodone long-acting but initiated the short acting o at 5 mg every 4 hours as needed so we can titrate him off the PCA.  #2. Sickle Cell Anemia: H/H stable  #3 Pharyngitis: On Cefdinir. Completed treatment  #4. S/P Fall: Continue activities as per physical therapy   LOS: 3 days   Ehsan Corvin,[name] 01/14/2015, 10:22 AM

## 2015-01-14 NOTE — Progress Notes (Signed)
Pt got up to use the urinal, i tried to get pt to put yellow socks on and he said no, but i got him to put his regular socks on.

## 2015-01-14 NOTE — Evaluation (Signed)
Physical Therapy Evaluation Patient Details Name: Garrett Valdez MRN: [id number] DOB: Dec 03, 1990 Today's Date: 01/14/2015   History of Present Illness  24 y.o. male admitted with a sickle cell pain crisis in his lower back that is making it difficult to walk. The pain extends from his mid spine to his buttocks.  Clinical Impression  Pt admitted with above diagnosis. Pt currently with functional limitations due to the deficits listed below (see PT Problem List).  Pt will benefit from skilled PT to increase their independence and safety with mobility to allow discharge to the venue listed below.    Pt ambulated 400' holding IV pole. SaO2 99% on RA with walking. Pt had one instance of B knees buckling while walking, requiring min/mod assist to prevent fall.      Follow Up Recommendations No PT follow up    Equipment Recommendations  Rolling walker with 5" wheels    Recommendations for Other Services       Precautions / Restrictions Precautions Precautions: Fall Precaution Comments: pt fell in hospital bathroom 01/12/15 Restrictions Weight Bearing Restrictions: No      Mobility  Bed Mobility Overal bed mobility: Modified Independent             General bed mobility comments: with rail  Transfers Overall transfer level: Needs assistance Equipment used:  (IV pole) Transfers: Sit to/from Stand Sit to Stand: Min guard         General transfer comment: min guard due to recent fall  Ambulation/Gait Ambulation/Gait assistance: Min guard;Min assist Ambulation Distance (Feet): 400 Feet Assistive device:  (holding IV pole with BUEs) Gait Pattern/deviations: Step-to pattern;Trunk flexed   Gait velocity interpretation: Below normal speed for age/gender General Gait Details: SaO2 99% on RA with walking, Heavy reliance on IV pole with BUEs for balance, for last 100' of walk pt became fatigued and had increased trunk flexion, one episode of B knees buckling when going over a bump  with IV pole requiring min A to prevent fall  Stairs            Wheelchair Mobility    Modified Rankin (Stroke Patients Only)       Balance Overall balance assessment: History of Falls                                           Pertinent Vitals/Pain Pain Assessment: 0-10 Pain Score: 7  Pain Location: low back Pain Descriptors / Indicators: Sore Pain Intervention(s): Limited activity within patient's tolerance;PCA encouraged;Monitored during session;Premedicated before session    Home Living Family/patient expects to be discharged to:: Other (Comment) (pt stays in a hotel in 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 [employer] over weekend) Living Arrangements: Children (pt has 72 year old daughter)                    Prior Function Level of Independence: Independent         Comments: works as Curator at [employer]        Extremity/Trunk Assessment   Upper Extremity Assessment: Overall WFL for tasks assessed           Lower Extremity Assessment: Overall WFL for tasks assessed      Cervical / Trunk Assessment: Normal  Communication   Communication: No difficulties  Cognition Arousal/Alertness: Awake/alert Behavior During  Therapy: WFL for tasks assessed/performed Overall Cognitive Status: Within Functional Limits for tasks assessed                      General Comments General comments (skin integrity, edema, etc.): fell in bathroom 01/12/15, B knees buckled today with walking    Exercises        Assessment/Plan    PT Assessment Patient needs continued PT services  PT Diagnosis Acute pain;Generalized weakness;Difficulty walking   PT Problem List Decreased activity tolerance;Decreased mobility;Pain;Decreased knowledge of use of DME  PT Treatment Interventions DME instruction;Gait training;Therapeutic exercise;Patient/family education   PT Goals (Current goals can be  found in the Care Plan section) Acute Rehab PT Goals Patient Stated Goal: to see his 273 y.o. daughter PT Goal Formulation: With patient Time For Goal Achievement: 01/28/15 Potential to Achieve Goals: Good    Frequency Min 3X/week   Barriers to discharge Decreased caregiver support      Co-evaluation               End of Session Equipment Utilized During Treatment: Gait belt Activity Tolerance: Patient limited by fatigue Patient left: in bed;with call bell/phone within reach;with bed alarm set Nurse Communication: Mobility status         Time: [id number] PT Time Calculation (min) (ACUTE ONLY): 38 min   Charges:   PT Evaluation $Initial PT Evaluation Tier I: 1 Procedure PT Treatments $Gait Training: 23-37 mins   PT G Codes:        Tamala SerUhlenberg, Ithiel Liebler Kistler 01/14/2015, 12:14 PM [phone]

## 2015-01-14 NOTE — Progress Notes (Signed)
Educated pt multiple times on the importance of keeping O2 in nose so that O2 sats and the end tital volume could be measured to ensure the pt was not getting too much pain medication to cause sedation. Pt continually has O2 out of nose and states that it is falling out of his nose when he is sleeping, even though pt was awake earlier during the shift with the O2 cannula out of his nose and was educated at that time as well.

## 2015-01-15 NOTE — Progress Notes (Signed)
Subjective: Patient is doing better today. He is getting ready for possible discharge tomorrow. Has been using his Dilaudid PCA on schedule. He's also been using the oxycodone schedule as well as when necessary. He thinks he'll be ready for discharge tomorrow. No shortness of breath no cough no nausea vomiting or diarrhea.  Objective: Vital signs in last 24 hours: Temp:  [97.7 F (36.5 C)-98.9 F (37.2 C)] 98.5 F (36.9 C) (03/05 0947) Pulse Rate:  [71-100] 96 (03/05 0947) Resp:  [12-22] 12 (03/05 1459) BP: (133-157)/(55-92) 134/75 mmHg (03/05 0947) SpO2:  [92 %-100 %] 99 % (03/05 1459) Weight change:  Last BM Date: 01/13/15  Intake/Output from previous day: 03/04 0701 - 03/05 0700 In: 3840 [P.O.:240; I.V.:3600] Out: 600 [Urine:600] Intake/Output this shift: Total I/O In: -  Out: 400 [Urine:400]  General appearance: alert, cooperative and no distress Eyes: conjunctivae/corneas clear. PERRL, EOM's intact. Fundi benign. Throat: lips, mucosa, and tongue normal; teeth and gums normal Neck: no adenopathy, no carotid bruit, no JVD, supple, symmetrical, trachea midline and thyroid not enlarged, symmetric, no tenderness/mass/nodules Back: symmetric, no curvature. ROM normal. No CVA tenderness. Resp: clear to auscultation bilaterally Chest wall: no tenderness Cardio: regular rate and rhythm, S1, S2 normal, no murmur, click, rub or gallop GI: soft, non-tender; bowel sounds normal; no masses,  no organomegaly Extremities: extremities normal, atraumatic, no cyanosis or edema Pulses: 2+ and symmetric Skin: Skin color, texture, turgor normal. No rashes or lesions Neurologic: Grossly normal  Lab Results:  Recent Labs  01/14/15 0520  WBC 10.5  HGB 11.8*  HCT 33.0*  PLT 321   BMET  Recent Labs  01/14/15 0520  NA 139  K 4.4  CL 99  CO2 32  GLUCOSE 106*  BUN 10  CREATININE 0.71  CALCIUM 9.4    Studies/Results: No results found.  Medications: I have reviewed the  patient's current medications.  Assessment/Plan: A 24 yo man admitted with Sickle cell painful crisis and acute Pharyngitis.  #1. Sickle Cell Painful Crisis: Patient is doing better and the moment. His behavior has also improved. We will get him ready for discharge home tomorrow. We'll decrease his Dilaudid PCA dose and continue with oral medications.  #2. Sickle Cell Anemia: H/H stable  #3 Pharyngitis: On Cefdinir. Complete treatment  #4. S/P Fall: Continue activities as per physical therapy   LOS: 4 days   Taevon Aschoff,[name] 01/15/2015, 5:09 PM

## 2015-01-16 LAB — CBC WITH DIFFERENTIAL/PLATELET
BASOS PCT: 1 % (ref 0–1)
Basophils Absolute: 0.1 10*3/uL (ref 0.0–0.1)
Eosinophils Absolute: 1.9 10*3/uL — ABNORMAL HIGH (ref 0.0–0.7)
Eosinophils Relative: 14 % — ABNORMAL HIGH (ref 0–5)
HCT: 30.5 % — ABNORMAL LOW (ref 39.0–52.0)
HEMOGLOBIN: 10.9 g/dL — AB (ref 13.0–17.0)
Lymphocytes Relative: 26 % (ref 12–46)
Lymphs Abs: 3.5 10*3/uL (ref 0.7–4.0)
MCH: 27.8 pg (ref 26.0–34.0)
MCHC: 35.7 g/dL (ref 30.0–36.0)
MCV: 77.8 fL — AB (ref 78.0–100.0)
MONO ABS: 2.4 10*3/uL — AB (ref 0.1–1.0)
Monocytes Relative: 18 % — ABNORMAL HIGH (ref 3–12)
NEUTROS ABS: 5.7 10*3/uL (ref 1.7–7.7)
NEUTROS PCT: 41 % — AB (ref 43–77)
PLATELETS: 315 10*3/uL (ref 150–400)
RBC: 3.92 MIL/uL — AB (ref 4.22–5.81)
RDW: 17.3 % — ABNORMAL HIGH (ref 11.5–15.5)
WBC: 13.6 10*3/uL — AB (ref 4.0–10.5)

## 2015-01-16 LAB — COMPREHENSIVE METABOLIC PANEL
ALT: 19 U/L (ref 0–53)
ANION GAP: 6 (ref 5–15)
AST: 51 U/L — ABNORMAL HIGH (ref 0–37)
Albumin: 4.2 g/dL (ref 3.5–5.2)
Alkaline Phosphatase: 83 U/L (ref 39–117)
BILIRUBIN TOTAL: 1.8 mg/dL — AB (ref 0.3–1.2)
BUN: 8 mg/dL (ref 6–23)
CO2: 30 mmol/L (ref 19–32)
Calcium: 9.3 mg/dL (ref 8.4–10.5)
Chloride: 101 mmol/L (ref 96–112)
Creatinine, Ser: 0.65 mg/dL (ref 0.50–1.35)
GFR calc Af Amer: >90 mL/min (ref >90)
GFR calc non Af Amer: >90 mL/min (ref >90)
Glucose, Bld: 101 mg/dL — ABNORMAL HIGH (ref 70–99)
POTASSIUM: 4.4 mmol/L (ref 3.5–5.1)
Sodium: 137 mmol/L (ref 135–145)
TOTAL PROTEIN: 7 g/dL (ref 6.0–8.3)

## 2015-01-16 MED ORDER — OXYCODONE HCL ER 10 MG PO T12A: 10.0000 mg | EXTENDED_RELEASE_TABLET | Freq: Two times a day (BID) | ORAL | Status: AC

## 2015-01-16 MED ORDER — CHLORHEXIDINE GLUCONATE 0.12 % MT SOLN: 15.0000 mL | Freq: Two times a day (BID) | OROMUCOSAL | Status: AC

## 2015-01-16 MED ORDER — CEFDINIR 125 MG/5ML PO SUSR: 300.0000 mg | Freq: Two times a day (BID) | ORAL | Status: DC

## 2015-01-16 MED ORDER — OXYCODONE HCL 5 MG PO TABS: 5.0000 mg | ORAL_TABLET | ORAL | Status: AC | PRN

## 2015-01-16 NOTE — Discharge Summary (Addendum)
Physician Discharge Summary  Patient ID: Garrett Valdez MRN: [id number] DOB/AGE: 23/03/1991 23 y.o.  Admit date: 01/11/2015 Discharge date: 01/16/2015  Admission Diagnoses:  Discharge Diagnoses:  Principal Problem:   Sickle cell pain crisis   Discharged Condition: good  Hospital Course: Patient admitted with sickle cell painful crisis and pharyngitis. He was have his psychiatric meltdown and psychiatry was consulted.  He was also on antibiotics for pharyngitis. At the time of discharge patient has stabilized. He has no local primary care physician. I have giving him prescriptions for long acting and short acting oxycodone until he sees his physician in [place]. He spends 5 days in [place] and the [place] at [place].  Consults: Psychiatry  Significant Diagnostic Studies: labs: CBC, CMP and retic count checked. No transfusion given.  Treatments: IV hydration, antibiotics: Cefdinir and analgesia: Dilaudid and Oxycodone  Discharge Exam: Blood pressure 162/63, pulse 91, temperature 98.6 F (37 C), temperature source Oral, resp. rate 20, height 5\' 9"  (1.753 m), weight 56.7 kg (125 lb), SpO2 97 %. General appearance: alert, cooperative and no distress Eyes: conjunctivae/corneas clear. PERRL, EOM's intact. Fundi benign. Neck: no adenopathy, no carotid bruit, no JVD, supple, symmetrical, trachea midline and thyroid not enlarged, symmetric, no tenderness/mass/nodules Back: symmetric, no curvature. ROM normal. No CVA tenderness. Resp: clear to auscultation bilaterally Chest wall: no tenderness Cardio: regular rate and rhythm, S1, S2 normal, no murmur, click, rub or gallop GI: soft, non-tender; bowel sounds normal; no masses,  no organomegaly Extremities: extremities normal, atraumatic, no cyanosis or edema Pulses: 2+ and symmetric Skin: Skin color, texture, turgor normal. No rashes or lesions Neurologic: Grossly normal  Disposition: 01-Home or Self Care     Medication List     STOP taking these medications        acetaminophen-codeine 300-30 MG per tablet  Commonly known as:  TYLENOL #3     amoxicillin 250 MG capsule  Commonly known as:  AMOXIL     HYDROcodone-acetaminophen 5-325 MG per tablet  Commonly known as:  NORCO/VICODIN      TAKE these medications        cefdinir 125 MG/5ML suspension  Commonly known as:  OMNICEF  Take 12 mLs (300 mg total) by mouth 2 (two) times daily.     chlorhexidine 0.12 % solution  Commonly known as:  PERIDEX  15 mLs by Mouth Rinse route 2 (two) times daily.     doxepin 10 MG capsule  Commonly known as:  SINEQUAN  Take 10 mg by mouth at bedtime.     folic acid 1 MG tablet  Commonly known as:  FOLVITE  Take 1 mg by mouth daily.     gabapentin 300 MG capsule  Commonly known as:  NEURONTIN  Take 300 mg by mouth 3 (three) times daily.     lidocaine 5 %  Commonly known as:  LIDODERM  Place 2 patches onto the skin daily. Remove & Discard patch within 12 hours or as directed by MD     oxyCODONE 5 MG immediate release tablet  Commonly known as:  Oxy IR/ROXICODONE  Take 1 tablet (5 mg total) by mouth every 4 (four) hours as needed for severe pain.     OxyCODONE 10 mg T12a 12 hr tablet  Commonly known as:  OXYCONTIN  Take 1 tablet (10 mg total) by mouth every 12 (twelve) hours.         SignedLonia Blood: Chirstina Haan,[name] 01/16/2015, 6:20 AM  Time spent 33 minutes

## 2015-01-16 NOTE — Progress Notes (Signed)
Pt left at this time with a "friend". Alert, oriented, and without c/o. Discharge instructions/prescriptions given/explained with pt verbalizing understanding.

## 2015-01-25 ENCOUNTER — Inpatient Hospital Stay (HOSPITAL_COMMUNITY): Payer: BLUE CROSS/BLUE SHIELD

## 2015-01-25 ENCOUNTER — Inpatient Hospital Stay (HOSPITAL_COMMUNITY)
Admission: EM | Admit: 2015-01-25 | Discharge: 2015-01-30 | DRG: 812 | Disposition: A | Discharge status: Home / Self Care | Payer: BLUE CROSS/BLUE SHIELD | Attending: Internal Medicine | Admitting: Internal Medicine

## 2015-01-25 ENCOUNTER — Encounter (HOSPITAL_COMMUNITY): Payer: Self-pay

## 2015-01-25 DIAGNOSIS — Q8901 Asplenia (congenital): Secondary | ICD-10-CM

## 2015-01-25 DIAGNOSIS — D72829 Elevated white blood cell count, unspecified: Secondary | ICD-10-CM | POA: Diagnosis present

## 2015-01-25 DIAGNOSIS — B349 Viral infection, unspecified: Secondary | ICD-10-CM

## 2015-01-25 DIAGNOSIS — Z885 Allergy status to narcotic agent status: Secondary | ICD-10-CM | POA: Diagnosis not present

## 2015-01-25 DIAGNOSIS — R7881 Bacteremia: Secondary | ICD-10-CM | POA: Diagnosis present

## 2015-01-25 DIAGNOSIS — R42 Dizziness and giddiness: Secondary | ICD-10-CM | POA: Diagnosis not present

## 2015-01-25 DIAGNOSIS — B955 Unspecified streptococcus as the cause of diseases classified elsewhere: Secondary | ICD-10-CM | POA: Diagnosis present

## 2015-01-25 DIAGNOSIS — R112 Nausea with vomiting, unspecified: Secondary | ICD-10-CM | POA: Diagnosis not present

## 2015-01-25 DIAGNOSIS — D72823 Leukemoid reaction: Secondary | ICD-10-CM | POA: Diagnosis present

## 2015-01-25 DIAGNOSIS — Z888 Allergy status to other drugs, medicaments and biological substances status: Secondary | ICD-10-CM | POA: Diagnosis not present

## 2015-01-25 DIAGNOSIS — D5700 Hb-SS disease with crisis, unspecified: Principal | ICD-10-CM | POA: Diagnosis present

## 2015-01-25 DIAGNOSIS — Z882 Allergy status to sulfonamides status: Secondary | ICD-10-CM | POA: Diagnosis not present

## 2015-01-25 DIAGNOSIS — Z87891 Personal history of nicotine dependence: Secondary | ICD-10-CM

## 2015-01-25 DIAGNOSIS — D57219 Sickle-cell/Hb-C disease with crisis, unspecified: Secondary | ICD-10-CM

## 2015-01-25 DIAGNOSIS — M549 Dorsalgia, unspecified: Secondary | ICD-10-CM | POA: Diagnosis not present

## 2015-01-25 LAB — CBC WITH DIFFERENTIAL/PLATELET
BASOS ABS: 0.2 10*3/uL — AB (ref 0.0–0.1)
Basophils Relative: 2 % — ABNORMAL HIGH (ref 0–1)
EOS PCT: 6 % — AB (ref 0–5)
Eosinophils Absolute: 0.5 10*3/uL (ref 0.0–0.7)
HEMATOCRIT: 31.3 % — AB (ref 39.0–52.0)
Hemoglobin: 11.2 g/dL — ABNORMAL LOW (ref 13.0–17.0)
Lymphocytes Relative: 24 % (ref 12–46)
Lymphs Abs: 2 10*3/uL (ref 0.7–4.0)
MCH: 27.8 pg (ref 26.0–34.0)
MCHC: 35.8 g/dL (ref 30.0–36.0)
MCV: 77.7 fL — AB (ref 78.0–100.0)
Monocytes Absolute: 1.8 10*3/uL — ABNORMAL HIGH (ref 0.1–1.0)
Monocytes Relative: 22 % — ABNORMAL HIGH (ref 3–12)
Neutro Abs: 4 10*3/uL (ref 1.7–7.7)
Neutrophils Relative %: 48 % (ref 43–77)
Platelets: 464 10*3/uL — ABNORMAL HIGH (ref 150–400)
RBC: 4.03 MIL/uL — ABNORMAL LOW (ref 4.22–5.81)
RDW: 16.7 % — ABNORMAL HIGH (ref 11.5–15.5)
WBC: 8.4 10*3/uL (ref 4.0–10.5)

## 2015-01-25 LAB — COMPREHENSIVE METABOLIC PANEL
ALK PHOS: 75 U/L (ref 39–117)
ALT: 11 U/L (ref 0–53)
AST: 25 U/L (ref 0–37)
Albumin: 4.4 g/dL (ref 3.5–5.2)
Anion gap: 5 (ref 5–15)
BILIRUBIN TOTAL: 1.3 mg/dL — AB (ref 0.3–1.2)
BUN: 9 mg/dL (ref 6–23)
CHLORIDE: 105 mmol/L (ref 96–112)
CO2: 27 mmol/L (ref 19–32)
Calcium: 9.6 mg/dL (ref 8.4–10.5)
Creatinine, Ser: 0.79 mg/dL (ref 0.50–1.35)
GFR calc non Af Amer: >90 mL/min (ref >90)
GLUCOSE: 85 mg/dL (ref 70–99)
POTASSIUM: 3.9 mmol/L (ref 3.5–5.1)
Sodium: 137 mmol/L (ref 135–145)
Total Protein: 7.5 g/dL (ref 6.0–8.3)

## 2015-01-25 LAB — CBC
HCT: 28.8 % — ABNORMAL LOW (ref 39.0–52.0)
HEMOGLOBIN: 10.1 g/dL — AB (ref 13.0–17.0)
MCH: 27.4 pg (ref 26.0–34.0)
MCHC: 35.1 g/dL (ref 30.0–36.0)
MCV: 78.3 fL (ref 78.0–100.0)
Platelets: 311 10*3/uL (ref 150–400)
RBC: 3.68 MIL/uL — ABNORMAL LOW (ref 4.22–5.81)
RDW: 16.2 % — AB (ref 11.5–15.5)
WBC: 17.2 10*3/uL — ABNORMAL HIGH (ref 4.0–10.5)

## 2015-01-25 LAB — MAGNESIUM: Magnesium: 1.3 mg/dL — ABNORMAL LOW (ref 1.5–2.5)

## 2015-01-25 LAB — INFLUENZA PANEL BY PCR (TYPE A & B)
H1N1FLUPCR: NOT DETECTED
INFLBPCR: NEGATIVE
Influenza A By PCR: NEGATIVE

## 2015-01-25 LAB — CREATININE, SERUM
Creatinine, Ser: 1.01 mg/dL (ref 0.50–1.35)
GFR calc non Af Amer: >90 mL/min (ref >90)

## 2015-01-25 LAB — RETICULOCYTES
RBC.: 4.03 MIL/uL — ABNORMAL LOW (ref 4.22–5.81)
RETIC COUNT ABSOLUTE: 322.4 10*3/uL — AB (ref 19.0–186.0)
RETIC CT PCT: 8 % — AB (ref 0.4–3.1)

## 2015-01-25 LAB — LACTATE DEHYDROGENASE: LDH: 254 U/L — ABNORMAL HIGH (ref 94–250)

## 2015-01-25 MED ORDER — LIDOCAINE 5 % EX PTCH
2.0000 | MEDICATED_PATCH | CUTANEOUS | Status: DC
  Administered 2015-01-25 – 2015-01-28 (×3): 2 via TRANSDERMAL
  Filled 2015-01-25 (×6): qty 2

## 2015-01-25 MED ORDER — SODIUM CHLORIDE 0.9 % IJ SOLN: 9.0000 mL | INTRAMUSCULAR | Status: DC | PRN

## 2015-01-25 MED ORDER — HYDROMORPHONE HCL 1 MG/ML IJ SOLN
1.0000 mg | Freq: Once | INTRAMUSCULAR | Status: AC
  Administered 2015-01-25: 1 mg via INTRAVENOUS
  Filled 2015-01-25: qty 1

## 2015-01-25 MED ORDER — HYDROMORPHONE HCL 2 MG/ML IJ SOLN: 2.0000 mg | Freq: Once | INTRAMUSCULAR | Status: DC

## 2015-01-25 MED ORDER — POLYETHYLENE GLYCOL 3350 17 G PO PACK: 17.0000 g | PACK | Freq: Every day | ORAL | Status: DC | PRN

## 2015-01-25 MED ORDER — OXYCODONE HCL ER 10 MG PO T12A
10.0000 mg | EXTENDED_RELEASE_TABLET | Freq: Two times a day (BID) | ORAL | Status: DC
  Administered 2015-01-26 – 2015-01-30 (×8): 10 mg via ORAL
  Filled 2015-01-25 (×10): qty 1

## 2015-01-25 MED ORDER — DEXTROSE-NACL 5-0.45 % IV SOLN
INTRAVENOUS | Status: DC
  Administered 2015-01-25 – 2015-01-29 (×8): via INTRAVENOUS

## 2015-01-25 MED ORDER — FOLIC ACID 1 MG PO TABS: 1.0000 mg | ORAL_TABLET | Freq: Every day | ORAL | Status: DC

## 2015-01-25 MED ORDER — ACETAMINOPHEN 325 MG PO TABS
650.0000 mg | ORAL_TABLET | Freq: Once | ORAL | Status: AC
  Administered 2015-01-25: 650 mg via ORAL
  Filled 2015-01-25: qty 2

## 2015-01-25 MED ORDER — FOLIC ACID 1 MG PO TABS
1.0000 mg | ORAL_TABLET | Freq: Every day | ORAL | Status: DC
  Administered 2015-01-25 – 2015-01-29 (×4): 1 mg via ORAL
  Filled 2015-01-25 (×6): qty 1

## 2015-01-25 MED ORDER — ENOXAPARIN SODIUM 40 MG/0.4ML ~~LOC~~ SOLN
40.0000 mg | SUBCUTANEOUS | Status: DC
  Filled 2015-01-25 (×6): qty 0.4

## 2015-01-25 MED ORDER — ONDANSETRON HCL 4 MG/2ML IJ SOLN
4.0000 mg | Freq: Four times a day (QID) | INTRAMUSCULAR | Status: DC | PRN
  Administered 2015-01-25 – 2015-01-29 (×3): 4 mg via INTRAVENOUS
  Filled 2015-01-25 (×3): qty 2

## 2015-01-25 MED ORDER — NALOXONE HCL 0.4 MG/ML IJ SOLN: 0.4000 mg | INTRAMUSCULAR | Status: DC | PRN

## 2015-01-25 MED ORDER — SENNOSIDES-DOCUSATE SODIUM 8.6-50 MG PO TABS
1.0000 | ORAL_TABLET | Freq: Two times a day (BID) | ORAL | Status: DC
  Administered 2015-01-27 – 2015-01-30 (×3): 1 via ORAL
  Filled 2015-01-25 (×10): qty 1

## 2015-01-25 MED ORDER — KETOROLAC TROMETHAMINE 30 MG/ML IJ SOLN
30.0000 mg | Freq: Once | INTRAMUSCULAR | Status: AC
  Administered 2015-01-25: 30 mg via INTRAVENOUS
  Filled 2015-01-25: qty 1

## 2015-01-25 MED ORDER — ACETAMINOPHEN 500 MG PO TABS
500.0000 mg | ORAL_TABLET | Freq: Once | ORAL | Status: AC
  Administered 2015-01-25: 500 mg via ORAL
  Filled 2015-01-25: qty 1

## 2015-01-25 MED ORDER — HYDROMORPHONE 2 MG/ML HIGH CONCENTRATION IV PCA SOLN
INTRAVENOUS | Status: DC
  Administered 2015-01-25: 1.2 mg via INTRAVENOUS
  Administered 2015-01-25: 17:00:00 via INTRAVENOUS
  Administered 2015-01-26 (×2): 0.6 mg via INTRAVENOUS
  Administered 2015-01-26: 4.8 mg via INTRAVENOUS
  Administered 2015-01-26: 4.2 mg via INTRAVENOUS
  Administered 2015-01-26: 3.6 mg via INTRAVENOUS
  Administered 2015-01-26: 1.2 mg via INTRAVENOUS
  Administered 2015-01-27: 4.6 mg via INTRAVENOUS
  Administered 2015-01-27: 17:00:00 via INTRAVENOUS
  Administered 2015-01-27: 6.2 mg via INTRAVENOUS
  Administered 2015-01-27: 1.2 mg via INTRAVENOUS
  Administered 2015-01-27: 17.2 mg via INTRAVENOUS
  Administered 2015-01-27: 3 mg via INTRAVENOUS
  Administered 2015-01-27: 7.2 mg via INTRAVENOUS
  Administered 2015-01-28: 23:00:00 via INTRAVENOUS
  Administered 2015-01-28: 6.6 mg via INTRAVENOUS
  Administered 2015-01-28: 3.4 mg via INTRAVENOUS
  Administered 2015-01-28: 7.2 mg via INTRAVENOUS
  Administered 2015-01-28: 7 mg via INTRAVENOUS
  Administered 2015-01-29: 75 mg via INTRAVENOUS
  Administered 2015-01-29: 7.6 mg via INTRAVENOUS
  Administered 2015-01-29: 11.4 mL via INTRAVENOUS
  Administered 2015-01-29: 9.4 mg via INTRAVENOUS
  Administered 2015-01-29: 12 mg via INTRAVENOUS
  Administered 2015-01-30: 10.6 mL via INTRAVENOUS
  Administered 2015-01-30: 6 mg via INTRAVENOUS
  Administered 2015-01-30: 02:00:00 via INTRAVENOUS
  Filled 2015-01-25 (×4): qty 25

## 2015-01-25 MED ORDER — DIPHENHYDRAMINE HCL 50 MG/ML IJ SOLN
25.0000 mg | Freq: Once | INTRAMUSCULAR | Status: AC
  Administered 2015-01-25: 25 mg via INTRAVENOUS
  Filled 2015-01-25: qty 1

## 2015-01-25 MED ORDER — HYDROMORPHONE HCL 2 MG/ML IJ SOLN
1.5000 mg | Freq: Once | INTRAMUSCULAR | Status: AC
  Administered 2015-01-25: 1.5 mg via INTRAVENOUS
  Filled 2015-01-25: qty 1

## 2015-01-25 MED ORDER — GABAPENTIN 300 MG PO CAPS
300.0000 mg | ORAL_CAPSULE | Freq: Three times a day (TID) | ORAL | Status: DC
  Administered 2015-01-25 – 2015-01-30 (×12): 300 mg via ORAL
  Filled 2015-01-25 (×17): qty 1

## 2015-01-25 MED ORDER — HYDROMORPHONE HCL 2 MG/ML IJ SOLN: 1.8000 mg | Freq: Once | INTRAMUSCULAR | Status: DC

## 2015-01-25 MED ORDER — SODIUM CHLORIDE 0.9 % IV BOLUS (SEPSIS)
1000.0000 mL | Freq: Once | INTRAVENOUS | Status: AC
  Administered 2015-01-25: 1000 mL via INTRAVENOUS

## 2015-01-25 MED ORDER — KETOROLAC TROMETHAMINE 30 MG/ML IJ SOLN
30.0000 mg | Freq: Four times a day (QID) | INTRAMUSCULAR | Status: DC
  Administered 2015-01-25 – 2015-01-30 (×19): 30 mg via INTRAVENOUS
  Filled 2015-01-25 (×26): qty 1

## 2015-01-25 MED ORDER — SODIUM CHLORIDE 0.9 % IV SOLN
25.0000 mg | INTRAVENOUS | Status: DC | PRN
  Filled 2015-01-25: qty 0.5

## 2015-01-25 MED ORDER — CEFEPIME HCL 2 G IJ SOLR
2.0000 g | Freq: Three times a day (TID) | INTRAMUSCULAR | Status: DC
  Administered 2015-01-25 – 2015-01-26 (×3): 2 g via INTRAVENOUS
  Filled 2015-01-25 (×4): qty 2

## 2015-01-25 MED ORDER — HYDROMORPHONE HCL 1 MG/ML IJ SOLN
1.0000 mg | INTRAMUSCULAR | Status: DC | PRN
  Administered 2015-01-26 – 2015-01-30 (×21): 1 mg via INTRAVENOUS
  Filled 2015-01-25 (×12): qty 1

## 2015-01-25 MED ORDER — DIPHENHYDRAMINE HCL 25 MG PO CAPS
25.0000 mg | ORAL_CAPSULE | Freq: Four times a day (QID) | ORAL | Status: DC | PRN
  Administered 2015-01-26: 25 mg via ORAL
  Administered 2015-01-27 – 2015-01-29 (×5): 50 mg via ORAL
  Filled 2015-01-25 (×3): qty 2
  Filled 2015-01-25: qty 1
  Filled 2015-01-25 (×2): qty 2
  Filled 2015-01-25: qty 1
  Filled 2015-01-25: qty 2

## 2015-01-25 MED ORDER — ACETAMINOPHEN 500 MG PO TABS
500.0000 mg | ORAL_TABLET | ORAL | Status: DC | PRN
Start: 2015-01-25 — End: 2015-01-30
  Administered 2015-01-26: 500 mg via ORAL
  Filled 2015-01-25 (×2): qty 1

## 2015-01-25 MED ORDER — ONDANSETRON HCL 4 MG/2ML IJ SOLN
4.0000 mg | Freq: Once | INTRAMUSCULAR | Status: AC
  Administered 2015-01-25: 4 mg via INTRAVENOUS
  Filled 2015-01-25: qty 2

## 2015-01-25 NOTE — ED Notes (Signed)
Pt c/o Sickle Cell Crisis w/ lower back pain x 2 days.  Pain score 8/10.  Pt reports taking all medications as prescriptions.  Pt did not contact [facility],

## 2015-01-25 NOTE — ED Provider Notes (Signed)
Patient presented to the ER with severe low back pain consistent with previous sickle cell crisis.  Face to face Exam: HEENT - PERRLA Lungs - CTAB Heart - RRR, no M/R/G Abd - S/NT/ND Neuro - alert, oriented x3  Plan: Patient was given multiple doses of narcotic analgesia, IV fluids, Toradol without any significant improvement. Will require hospitalization for further management.   Gilda Creasehristopher J Jammie Clink, MD 01/25/15 1151

## 2015-01-25 NOTE — Progress Notes (Signed)
ANTIBIOTIC CONSULT NOTE - INITIAL  Pharmacy Consult for Cefepime Indication: Fever in an immunocompromised patient  Allergies  Allergen Reactions  . Sulfur Shortness Of Breath and Itching  . Claritin [Loratadine] Other (See Comments)    "motor jerks, like it is going to throw him into a seizure"  . Morphine And Related Swelling    Large doses  . Vitamin B Complex [B Complex] Itching    Patient Measurements:    Vital Signs: Temp: 102.9 F (39.4 C) (03/15 1305) Temp Source: Oral (03/15 1305) BP: 116/62 mmHg (03/15 1207) Pulse Rate: 77 (03/15 1207) Intake/Output from previous day:   Intake/Output from this shift:    Labs:  Recent Labs  01/25/15 0758  WBC 8.4  HGB 11.2*  PLT 464*  CREATININE 0.79   CrCl cannot be calculated (Unknown ideal weight.). No results for input(s): VANCOTROUGH, VANCOPEAK, VANCORANDOM, GENTTROUGH, GENTPEAK, GENTRANDOM, TOBRATROUGH, TOBRAPEAK, TOBRARND, AMIKACINPEAK, AMIKACINTROU, AMIKACIN in the last 72 hours.   Microbiology: Recent Results (from the past 720 hour(s))  Culture, Group A Strep     Status: Abnormal   Collection Time: 01/07/15 12:00 PM  Result Value Ref Range Status   Strep A Culture Comment (A)  Final    Comment: (NOTE) Beta-hemolytic colonies, not group A Streptococcus isolated. Performed At: [facility] [address] [place], [place] [id number] Mila HomerHancock William F MD [id number]:[phone]     Medical History: Past Medical History  Diagnosis Date  . Sickle cell anemia     Medications:  Anti-infectives    Start     Dose/Rate Route Frequency Ordered Stop   01/25/15 1500  ceFEPIme (MAXIPIME) 2 g in dextrose 5 % 50 mL IVPB     2 g 100 mL/hr over 30 Minutes Intravenous 3 times per day 01/25/15 1359       Assessment: 24yo M with [place] pain crisis, viral syndrome, and fever. Pharmacy is asked to dose Cefepime for broad-coverage in this immunocompromised patient.  3/15 >> Cefepime >>  Tmax: 102.9 WBCs: wnl Renal:  SCr wnl, CrCl >100N  Goal of Therapy:  Appropriate antibiotic dosing for renal function; eradication of infection  Plan:  Cefepime 2g IV q8h. Follow up renal fxn, culture results, and clinical course.  Charolotte Ekeom Gaege Sangalang, PharmD, pager [phone]. 01/25/2015,2:01 PM.

## 2015-01-25 NOTE — Progress Notes (Signed)
Pt received to room 1338 via stretcher from ED. Pt's temp is now 103. Dr. Ashley RoyaltyMatthews notified and orders received.

## 2015-01-25 NOTE — Progress Notes (Signed)
Pt continues to have fever with no obvious source. Influenza PCR negative. CXR negative. U/A ordered but not yet collected. Will await U/A cultures and treat supportively. Continue antibiotics empirically. Tylenol for fever.

## 2015-01-25 NOTE — Progress Notes (Signed)
Outside lab called with results of blood cultures: gram positive cocci pairs. On call provider notified.

## 2015-01-25 NOTE — ED Provider Notes (Signed)
CSN: [id number]     Arrival date & time 01/25/15  4098 History   First MD Initiated Contact with Patient 01/25/15 [phone]     Chief Complaint  Patient presents with  . Sickle Cell Pain Crisis  . Back Pain     (Consider location/radiation/quality/duration/timing/severity/associated sxs/prior Treatment) HPI Comments: Patient with a history of Sickle Cell Anemia presents today with lower back pain.  He reports that the pain has been constant since yesterday morning.  Pain does not radiate.  Pain gradually worsening.  He reports that the pain is the same pain that he typically gets with a Sickle Cell Pain Crisis.  Pain worse with movement and ambulation.  He has taken Oxycodone, Oxycontin, and used Lidocaine patches without relief.  He denies any acute injury or trauma.  He denies numbness, tingling, bowel/bladder incontinence, fever, cough, chest pain, or SOB.    The history is provided by the patient.    Past Medical History  Diagnosis Date  . Sickle cell anemia    History reviewed. No pertinent past surgical history. History reviewed. No pertinent family history. History  Substance Use Topics  . Smoking status: Former Smoker -- 0.10 packs/day    Types: Cigarettes    Quit date: 12/13/2014  . Smokeless tobacco: Never Used  . Alcohol Use: No    Review of Systems  All other systems reviewed and are negative.     Allergies  Sulfur; Claritin; Morphine and related; and Vitamin b complex  Home Medications   Prior to Admission medications   Medication Sig Start Date End Date Taking? Authorizing Provider  cefdinir (OMNICEF) 125 MG/5ML suspension Take 12 mLs (300 mg total) by mouth 2 (two) times daily. 01/16/15   Rometta Emery, MD  chlorhexidine (PERIDEX) 0.12 % solution 15 mLs by Mouth Rinse route 2 (two) times daily. 01/16/15   Rometta Emery, MD  doxepin (SINEQUAN) 10 MG capsule Take 10 mg by mouth at bedtime.    Historical Provider, MD  folic acid (FOLVITE) 1 MG tablet Take 1 mg  by mouth daily.    Historical Provider, MD  gabapentin (NEURONTIN) 300 MG capsule Take 300 mg by mouth 3 (three) times daily.    Historical Provider, MD  lidocaine (LIDODERM) 5 % Place 2 patches onto the skin daily. Remove & Discard patch within 12 hours or as directed by MD    Historical Provider, MD  oxyCODONE (OXY IR/ROXICODONE) 5 MG immediate release tablet Take 1 tablet (5 mg total) by mouth every 4 (four) hours as needed for severe pain. 01/16/15   Rometta Emery, MD  OxyCODONE (OXYCONTIN) 10 mg T12A 12 hr tablet Take 1 tablet (10 mg total) by mouth every 12 (twelve) hours. 01/16/15   Rometta Emery, MD   BP 118/67 mmHg  Pulse 93  Temp(Src) 98.3 F (36.8 C) (Oral)  Resp 16  SpO2 100% Physical Exam  Constitutional: He appears well-developed and well-nourished.  HENT:  Head: Normocephalic and atraumatic.  Mouth/Throat: Oropharynx is clear and moist.  Neck: Normal range of motion. Neck supple.  Cardiovascular: Normal rate, regular rhythm, normal heart sounds and intact distal pulses.   Pulmonary/Chest: Effort normal and breath sounds normal. No respiratory distress. He has no wheezes. He has no rales.  Abdominal: Soft. Bowel sounds are normal. He exhibits no distension and no mass. There is no tenderness. There is no rebound and no guarding.  Musculoskeletal: Normal range of motion.       Lumbar back: He exhibits tenderness  and bony tenderness. He exhibits no swelling, no edema and no deformity.  Neurological: He is alert.  Reflex Scores:      Patellar reflexes are 2+ on the right side and 2+ on the left side. Distal sensation of both feet intact 5/5 muscle strength of lower extremities bilaterally  Skin: Skin is warm and dry.  Psychiatric: He has a normal mood and affect.  Nursing note and vitals reviewed.   ED Course  Procedures (including critical care time) Labs Review Labs Reviewed  CBC WITH DIFFERENTIAL/PLATELET  COMPREHENSIVE METABOLIC PANEL  RETICULOCYTES     Imaging Review No results found.   EKG Interpretation None      9:10 AM Reassessed patient.  He reports mild improvement in pain, but states that he is still having significant pain.  10:29 AM Reassessed patient.  He currently rates his pain a 5/10.  He states that he does not want to be admitted, but is not feeling quite ready to go home.  Pain is decreasing.  Will reassess shortly. 11:00 AM Patient requesting Toradol at this time.  11:30 AM Reassessed patient.  He reports that he is still having significant pain.  Will admit. 11:40 AM Discussed with Dr. Ashley RoyaltyMatthews who has agreed to admit the patient.    MDM   Final diagnoses:  None    Patient with a history of Sickle Cell presents today with lower back pain.  He reports that the pain is the pain that he typically gets with a Sickle Cell Pain Crisis.  Pain did not improve after given IVF, 3 doses of IV Dilaudid, and 1 dose of Toradol.   Therefore, patient admitted to Dr. Ashley RoyaltyMatthews for additional management.  Hemoglobin is stable.  Labs showing elevated retic count consistent with sickle cell crisis.     Santiago GladHeather Senan Urey, PA-C 01/25/15 1440  Gilda Creasehristopher J Pollina, MD 01/25/15 [phone]

## 2015-01-25 NOTE — ED Notes (Signed)
Pt falling asleep while RN giving medications. Pt appears very drowsy.

## 2015-01-25 NOTE — ED Notes (Signed)
Gave patient comfort o2 2lo2

## 2015-01-25 NOTE — ED Notes (Signed)
Patient transported to X-ray 

## 2015-01-25 NOTE — H&P (Addendum)
Hospital Admission Note Date: 01/25/2015  Patient name: Garrett Valdez Medical record number: [id number] Date of birth: 03/30/1991 Age: 24 y.o. Gender: male PCP: Constance Holster, MD  Phone: [phone]   Attending physician: Altha Harm, MD  Chief Complaint:Pain in back x 2 days  History of Present Illness: [name] is Valdez young man with Hb [place] He has been residing in [place] because of his job at [employer].He was recently hospitalized and reports that he has been relatively pain free until 3 days ago when he started having chills and then body aches. This seems to have triggered Valdez crisis and he took both Oxycontin and immediate release Oxycodone to attempt to manage the pain. However it was ineffective and he presented to the ED for further pain management. He reports that the pain is aching and throbbing in nature and localized to his lower and upper back. He rates the pain as 10/10 and so far unrelieved with the medication that he has received.  He also c/o body aches and chills. He has been exposed to his sick daughter who had Valdez fever 3 days ago before he started having symptoms. He denies Valdez measured fever but has been warm to the touch. He has not had any N/V/D.  With regard to his SCD, he does not have consistent care and although he has identified [facility] as his Sickle Cell Disease provider, he has never been seen in the [facility]le Cell[facility]Hematology Clinic and has only interfaced with [facility] during acute hospitalizations for Sickle Cell Crises.   Valdez review of his records show that his baseline Hb is 10-12g/dL, WBC 8.5, Reticulocytes 213 (5.14%), LDH 300.   Scheduled Meds: .  HYDROmorphone (DILAUDID) injection  1.5 mg Intravenous Once   Followed by  .  HYDROmorphone (DILAUDID) injection  1.8 mg Intravenous Once   Followed by  .  HYDROmorphone (DILAUDID) injection  2 mg Intravenous Once   Continuous Infusions:  PRN  Meds:. Allergies: Sulfur; Claritin; Morphine and related; and Vitamin b complex Past Medical History  Diagnosis Date  . Sickle cell anemia    History reviewed. No pertinent past surgical history. History reviewed. No pertinent family history. History   Social History  . Marital Status: Single    Spouse Name: N/Valdez  . Number of Children: N/Valdez  . Years of Education: N/Valdez   Occupational History  . Not on file.   Social History Main Topics  . Smoking status: Former Smoker -- 0.10 packs/day    Types: Cigarettes    Quit date: 12/13/2014  . Smokeless tobacco: Never Used  . Alcohol Use: No  . Drug Use: No  . Sexual Activity: Yes   Other Topics Concern  . Not on file   Social History Narrative   Review of Systems: Valdez comprehensive review of systems was negative except as noted in the HP. Physical Exam: No intake or output data in the 24 hours ending 01/25/15 1237 General: Alert, awake, oriented x3, in no acute distress.  HEENT: [place]/AT PEERL, EOMI, anicteric Neck: Trachea midline,  no masses, no thyromegal,y no JVD, no carotid bruit OROPHARYNX:  Moist, No exudate/ erythema/lesions.  Heart: Regular rate and rhythm, without murmurs, rubs, gallops, PMI non-displaced, no heaves or thrills on palpation.  Lungs: Clear to auscultation, no wheezing or rhonchi noted. No increased vocal fremitus resonant to percussion  Abdomen: Soft, nontender, nondistended, positive bowel sounds, no masses no hepatosplenomegaly noted.  Neuro: No focal neurological deficits  noted cranial nerves II through XII grossly intact. DTRs 2+ bilaterally upper and lower extremities. Strength functional in bilateral upper and lower extremities. Musculoskeletal: No warm swelling or erythema around joints, no spinal tenderness noted. Psychiatric: Patient alert and oriented x3, good insight and cognition, good recent to remote recall. Lymph node survey: No cervical axillary or inguinal lymphadenopathy noted.  Lab  results:  Recent Labs  01/25/15 0758  NA 137  K 3.9  CL 105  CO2 27  GLUCOSE 85  BUN 9  CREATININE 0.79  CALCIUM 9.6    Recent Labs  01/25/15 0758  AST 25  ALT 11  ALKPHOS 75  BILITOT 1.3*  PROT 7.5  ALBUMIN 4.4   No results for input(s): LIPASE, AMYLASE in the last 72 hours.  Recent Labs  01/25/15 0758  WBC 8.4  NEUTROABS 4.0  HGB 11.2*  HCT 31.3*  MCV 77.7*  PLT 464*   No results for input(s): CKTOTAL, CKMB, CKMBINDEX, TROPONINI in the last 72 hours. Invalid input(s): POCBNP No results for input(s): DDIMER in the last 72 hours. No results for input(s): HGBA1C in the last 72 hours. No results for input(s): CHOL, HDL, LDLCALC, TRIG, CHOLHDL, LDLDIRECT in the last 72 hours. No results for input(s): TSH, T4TOTAL, T3FREE, THYROIDAB in the last 72 hours.  Invalid input(s): FREET3  Recent Labs  01/25/15 0758  RETICCTPCT 8.0*     Assessment and Plan: Viral syndrome: Pt presents with Valdez viral syndrome and exposure tosick contact. Will obtain Valdez Influenza PCR to r/o flu. Meanwhile wil place on droplet isolation.  Hb [place] with crisis: Pt presents with what appears to be Sickle Cell Crisis. Will treat as crisis and initiate Dilaudid by PCA.Pt will also receive Toradol and continue Oxycontin. Also placeon hypotonic IVF D5.45. Re-assess progress in 4 hours of PCA use.   Anemia of chronic disease: Hb Stable continue to monitor.  DVT Prophylaxis: Lovenox Daily  F/E/N: Regular diet, IVF as noted above. Senna-S and Mralax for bowel regimen in ligh tof opiate use which could lead to constipation  Activity: Up as tolerated  Kindle Strohmeier Valdez. 01/25/2015, 12:37 PM   1:41 PM Received Valdez call from nurse in the ED reporting that patient now has Valdez temperature of 102. 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  Garrett Valdez.

## 2015-01-26 DIAGNOSIS — R42 Dizziness and giddiness: Secondary | ICD-10-CM

## 2015-01-26 DIAGNOSIS — A499 Bacterial infection, unspecified: Secondary | ICD-10-CM

## 2015-01-26 DIAGNOSIS — D72829 Elevated white blood cell count, unspecified: Secondary | ICD-10-CM

## 2015-01-26 LAB — CBC WITH DIFFERENTIAL/PLATELET
Basophils Absolute: 0 10*3/uL (ref 0.0–0.1)
Basophils Relative: 0 % (ref 0–1)
EOS ABS: 0 10*3/uL (ref 0.0–0.7)
Eosinophils Relative: 0 % (ref 0–5)
HCT: 28.9 % — ABNORMAL LOW (ref 39.0–52.0)
Hemoglobin: 10.5 g/dL — ABNORMAL LOW (ref 13.0–17.0)
Lymphocytes Relative: 1 % — ABNORMAL LOW (ref 12–46)
Lymphs Abs: 0.6 10*3/uL — ABNORMAL LOW (ref 0.7–4.0)
MCH: 28.1 pg (ref 26.0–34.0)
MCHC: 36.3 g/dL — ABNORMAL HIGH (ref 30.0–36.0)
MCV: 77.3 fL — ABNORMAL LOW (ref 78.0–100.0)
MONO ABS: 3.6 10*3/uL — AB (ref 0.1–1.0)
Monocytes Relative: 6 % (ref 3–12)
NEUTROS PCT: 93 % — AB (ref 43–77)
Neutro Abs: 55.8 10*3/uL — ABNORMAL HIGH (ref 1.7–7.7)
PLATELETS: 342 10*3/uL (ref 150–400)
RBC: 3.74 MIL/uL — ABNORMAL LOW (ref 4.22–5.81)
RDW: 16.8 % — ABNORMAL HIGH (ref 11.5–15.5)
WBC: 60 10*3/uL — AB (ref 4.0–10.5)

## 2015-01-26 LAB — URINALYSIS, ROUTINE W REFLEX MICROSCOPIC
Bilirubin Urine: NEGATIVE
GLUCOSE, UA: NEGATIVE mg/dL
HGB URINE DIPSTICK: NEGATIVE
Ketones, ur: NEGATIVE mg/dL
Leukocytes, UA: NEGATIVE
Nitrite: NEGATIVE
Protein, ur: NEGATIVE mg/dL
SPECIFIC GRAVITY, URINE: 1.014 (ref 1.005–1.030)
Urobilinogen, UA: 0.2 mg/dL (ref 0.0–1.0)
pH: 5.5 (ref 5.0–8.0)

## 2015-01-26 LAB — BASIC METABOLIC PANEL
ANION GAP: 10 (ref 5–15)
BUN: 16 mg/dL (ref 6–23)
CALCIUM: 8.1 mg/dL — AB (ref 8.4–10.5)
CO2: 24 mmol/L (ref 19–32)
CREATININE: 1.11 mg/dL (ref 0.50–1.35)
Chloride: 105 mmol/L (ref 96–112)
GFR calc non Af Amer: >90 mL/min (ref >90)
Glucose, Bld: 101 mg/dL — ABNORMAL HIGH (ref 70–99)
Potassium: 4.1 mmol/L (ref 3.5–5.1)
SODIUM: 139 mmol/L (ref 135–145)

## 2015-01-26 MED ORDER — CEFTRIAXONE SODIUM IN DEXTROSE 20 MG/ML IV SOLN
1.0000 g | INTRAVENOUS | Status: DC
  Filled 2015-01-26: qty 50

## 2015-01-26 MED ORDER — VANCOMYCIN HCL IN DEXTROSE 750-5 MG/150ML-% IV SOLN
750.0000 mg | Freq: Two times a day (BID) | INTRAVENOUS | Status: DC
  Administered 2015-01-26 (×3): 750 mg via INTRAVENOUS
  Filled 2015-01-26 (×4): qty 150

## 2015-01-26 MED ORDER — CEFTRIAXONE SODIUM IN DEXTROSE 40 MG/ML IV SOLN
2.0000 g | INTRAVENOUS | Status: DC
  Administered 2015-01-26 – 2015-01-29 (×4): 2 g via INTRAVENOUS
  Filled 2015-01-26 (×5): qty 50
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
## 2015-01-26 NOTE — Progress Notes (Signed)
ANTIBIOTIC CONSULT NOTE - INITIAL  Pharmacy Consult for Cefepime/+Vancomycin 3/16 Indication: Fever in an immunocompromised patient  Allergies  Allergen Reactions  . Sulfur Shortness Of Breath and Itching  . Claritin [Loratadine] Other (See Comments)    "motor jerks, like it is going to throw him into a seizure"  . Morphine And Related Swelling    Large doses  . Vitamin B Complex [B Complex] Itching    Patient Measurements: Height: 5\' 9"  (175.3 cm) Weight: 125 lb (56.7 kg) IBW/kg (Calculated) : 70.7  Vital Signs: Temp: 98.5 F (36.9 C) (03/15 2159) Temp Source: Oral (03/15 2159) BP: 97/50 mmHg (03/15 2159) Pulse Rate: 109 (03/15 2159) Intake/Output from previous day:   Intake/Output from this shift:    Labs:  Recent Labs  01/25/15 0758 01/25/15 1735  WBC 8.4 17.2*  HGB 11.2* 10.1*  PLT 464* 311  CREATININE 0.79 1.01   Estimated Creatinine Clearance: 91.2 mL/min (by C-G formula based on Cr of 1.01). No results for input(s): VANCOTROUGH, VANCOPEAK, VANCORANDOM, GENTTROUGH, GENTPEAK, GENTRANDOM, TOBRATROUGH, TOBRAPEAK, TOBRARND, AMIKACINPEAK, AMIKACINTROU, AMIKACIN in the last 72 hours.   Microbiology: Recent Results (from the past 720 hour(s))  Culture, Group A Strep     Status: Abnormal   Collection Time: 01/07/15 12:00 PM  Result Value Ref Range Status   Strep A Culture Comment (A)  Final    Comment: (NOTE) Beta-hemolytic colonies, not group A Streptococcus isolated. Performed At: [facility] [address] [place], [place] [id number] Mila HomerHancock William F MD WU:[id number]:[phone]   Blood culture (routine x 2)     Status: None (Preliminary result)   Collection Time: 01/25/15  1:36 PM  Result Value Ref Range Status   Specimen Description BLOOD RIGHT FOREARM  Final   Special Requests BOTTLES DRAWN AEROBIC AND ANAEROBIC 4.5CC  Final   Culture   Final    GRAM POSITIVE COCCI IN PAIRS Note: Gram Stain Report Called to: MELVINA MOYE AT 11:44 P.M. ON 01/25/2015  WARRB Performed at [facility]    Report Status PENDING  Incomplete  Blood culture (routine x 2)     Status: None (Preliminary result)   Collection Time: 01/25/15  1:36 PM  Result Value Ref Range Status   Specimen Description BLOOD RAC  Final   Special Requests BOTTLES DRAWN AEROBIC AND ANAEROBIC 4.5CC  Final   Culture   Final    GRAM POSITIVE COCCI IN PAIRS Note: Gram Stain Report Called to,Read Back By and Verified With: MELVINA MOYE AT 11:44 P.M. ON 01/25/2015 WARRB Performed at [facility]    Report Status PENDING  Incomplete    Medical History: Past Medical History  Diagnosis Date  . Sickle cell anemia     Medications:  Anti-infectives    Start     Dose/Rate Route Frequency Ordered Stop   01/25/15 1500  ceFEPIme (MAXIPIME) 2 g in dextrose 5 % 50 mL IVPB     2 g 100 mL/hr over 30 Minutes Intravenous 3 times per day 01/25/15 1359       Assessment: 24yo M with [place] pain crisis, viral syndrome, and fever. Pharmacy is asked to dose Cefepime for broad-coverage in this immunocompromised patient.  3/15 >> Cefepime >> 3/16>>Vancomycin >>  Tmax: 102.9 WBCs: wnl Renal: SCr wnl, CrCl >100N  Goal of Therapy:  Appropriate antibiotic dosing for renal function; eradication of infection  Plan:  Cefepime 2g IV q8h. Vancomycin 750mg  IV q12h  Follow up renal fxn, culture results, levels and clinical course.  Chilton SiGreen,  Elizabethanne Lusher R  01/26/2015,12:09 AM.

## 2015-01-27 DIAGNOSIS — B953 Streptococcus pneumoniae as the cause of diseases classified elsewhere: Secondary | ICD-10-CM

## 2015-01-27 DIAGNOSIS — R7881 Bacteremia: Secondary | ICD-10-CM

## 2015-01-27 DIAGNOSIS — D72823 Leukemoid reaction: Secondary | ICD-10-CM | POA: Diagnosis not present

## 2015-01-27 LAB — COMPREHENSIVE METABOLIC PANEL
ALBUMIN: 3.3 g/dL — AB (ref 3.5–5.2)
ALK PHOS: 79 U/L (ref 39–117)
ALT: 12 U/L (ref 0–53)
ANION GAP: 8 (ref 5–15)
AST: 34 U/L (ref 0–37)
BILIRUBIN TOTAL: 2 mg/dL — AB (ref 0.3–1.2)
BUN: 11 mg/dL (ref 6–23)
CO2: 27 mmol/L (ref 19–32)
Calcium: 8.4 mg/dL (ref 8.4–10.5)
Chloride: 103 mmol/L (ref 96–112)
Creatinine, Ser: 0.88 mg/dL (ref 0.50–1.35)
GFR calc Af Amer: >90 mL/min (ref >90)
Glucose, Bld: 96 mg/dL (ref 70–99)
POTASSIUM: 3.7 mmol/L (ref 3.5–5.1)
SODIUM: 138 mmol/L (ref 135–145)
TOTAL PROTEIN: 6.1 g/dL (ref 6.0–8.3)

## 2015-01-27 LAB — CULTURE, BLOOD (ROUTINE X 2)

## 2015-01-27 LAB — CBC WITH DIFFERENTIAL/PLATELET
BASOS ABS: 0 10*3/uL (ref 0.0–0.1)
Basophils Relative: 0 % (ref 0–1)
EOS ABS: 0 10*3/uL (ref 0.0–0.7)
EOS PCT: 0 % (ref 0–5)
HEMATOCRIT: 26.1 % — AB (ref 39.0–52.0)
Hemoglobin: 9.6 g/dL — ABNORMAL LOW (ref 13.0–17.0)
Lymphocytes Relative: 5 % — ABNORMAL LOW (ref 12–46)
Lymphs Abs: 3.1 10*3/uL (ref 0.7–4.0)
MCH: 28.4 pg (ref 26.0–34.0)
MCHC: 36.8 g/dL — ABNORMAL HIGH (ref 30.0–36.0)
MCV: 77.2 fL — ABNORMAL LOW (ref 78.0–100.0)
MONO ABS: 3.1 10*3/uL — AB (ref 0.1–1.0)
MONOS PCT: 5 % (ref 3–12)
Neutro Abs: 55.9 10*3/uL — ABNORMAL HIGH (ref 1.7–7.7)
Neutrophils Relative %: 90 % — ABNORMAL HIGH (ref 43–77)
Platelets: 292 10*3/uL (ref 150–400)
RBC: 3.38 MIL/uL — AB (ref 4.22–5.81)
RDW: 16.8 % — ABNORMAL HIGH (ref 11.5–15.5)
WBC Morphology: INCREASED
WBC: 62.1 10*3/uL (ref 4.0–10.5)

## 2015-01-27 NOTE — Progress Notes (Signed)
24 hour total Dilaudid PCA high dose 19 mg 33 demands 30 deliveries

## 2015-01-27 NOTE — Care Management Note (Signed)
CARE MANAGEMENT NOTE 01/27/2015  Patient:  Garrett Valdez,Garrett Valdez   Account Number:  [id number]  Date Initiated:  01/27/2015  Documentation initiated by:  Sandford CrazeLEMENTS,Jasper Ruminski  Subjective/Objective Assessment:   24 [facility]     Action/Plan:   [place] in [place] the weekend with his daughter and works in the [employer] during the week.   Anticipated DC Date:  01/30/2015   Anticipated DC Plan:  HOME/SELF CARE      DC Planning Services  CM consult      Choice offered to / List presented to:             Status of service:  In process, will continue to follow Medicare Important Message given?   (If response is "NO", the following Medicare IM given date fields will be blank) Date Medicare IM given:   Medicare IM given by:   Date Additional Medicare IM given:   Additional Medicare IM given by:    Discharge Disposition:    Per UR Regulation:  Reviewed for med. necessity/level of care/duration of stay  If discussed at Long Length of Stay Meetings, dates discussed:    Comments:  01/27/15 Sandford Crazeora Teaghan Formica RN,BSN,NCM [phone] Chart reviewed and CM following for DC needs.
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
## 2015-01-28 LAB — CBC WITH DIFFERENTIAL/PLATELET
BASOS ABS: 0 10*3/uL (ref 0.0–0.1)
Basophils Relative: 0 % (ref 0–1)
EOS ABS: 0.6 10*3/uL (ref 0.0–0.7)
Eosinophils Relative: 2 % (ref 0–5)
HEMATOCRIT: 25.8 % — AB (ref 39.0–52.0)
Hemoglobin: 9.1 g/dL — ABNORMAL LOW (ref 13.0–17.0)
LYMPHS PCT: 14 % (ref 12–46)
Lymphs Abs: 4.4 10*3/uL — ABNORMAL HIGH (ref 0.7–4.0)
MCH: 27.5 pg (ref 26.0–34.0)
MCHC: 35.3 g/dL (ref 30.0–36.0)
MCV: 77.9 fL — ABNORMAL LOW (ref 78.0–100.0)
MONO ABS: 1.6 10*3/uL — AB (ref 0.1–1.0)
MONOS PCT: 5 % (ref 3–12)
NEUTROS PCT: 79 % — AB (ref 43–77)
NRBC: 1 /100{WBCs} — AB
Neutro Abs: 24.8 10*3/uL — ABNORMAL HIGH (ref 1.7–7.7)
PLATELETS: 286 10*3/uL (ref 150–400)
RBC: 3.31 MIL/uL — ABNORMAL LOW (ref 4.22–5.81)
RDW: 16.7 % — AB (ref 11.5–15.5)
WBC: 31.4 10*3/uL — AB (ref 4.0–10.5)

## 2015-01-28 MED ORDER — BENZONATATE 100 MG PO CAPS
100.0000 mg | ORAL_CAPSULE | Freq: Four times a day (QID) | ORAL | Status: DC | PRN
  Administered 2015-01-28: 100 mg via ORAL
  Filled 2015-01-28: qty 1

## 2015-01-28 NOTE — Progress Notes (Signed)
Patient c/o nonproductive cough,Dr. Mikeal HawthorneGarba made aware and ordered Tessalon 100 mg every 6 hours PRN. RN to continue to monitor.

## 2015-01-28 NOTE — Progress Notes (Signed)
Subjective: A 24 year old gentleman with history of sickle cell disease who was admitted with sepsis and leukocytosis. Patient had leukemoid reaction with his white count as high as 62,000. The source is not entirely clear. He also continues to have pain in his right shoulder and legs. Patient has had recurrent strep infections with no obvious source. 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 He has felt much better overnight with no fever or chills. His white count has dropped from 62,000 to 31,000. He continues to do very well. Patient is currently on Dilaudid PCA and is getting adequate pain control.  Objective: Vital signs in last 24 hours: Temp:  [98.2 F (36.8 C)-98.9 F (37.2 C)] 98.9 F (37.2 C) (03/18 1033) Pulse Rate:  [81-90] 84 (03/18 1033) Resp:  [9-18] 16 (03/18 1033) BP: (100-132)/(50-73) 120/50 mmHg (03/18 1033) SpO2:  [96 %-100 %] 100 % (03/18 1033) Weight:  [57.788 kg (127 lb 6.4 oz)] 57.788 kg (127 lb 6.4 oz) (03/18 0609) Weight change:  Last BM Date: 01/20/15  Intake/Output from previous day: 03/17 0701 - 03/18 0700 In: 800 [P.O.:800] Out: 1000 [Urine:1000] Intake/Output this shift: Total I/O In: -  Out: 400 [Urine:400]  General appearance: alert, cooperative and no distress Eyes: conjunctivae/corneas clear. PERRL, EOM's intact. Fundi benign. Neck: no adenopathy, no carotid bruit, no JVD, supple, symmetrical, trachea midline and thyroid not enlarged, symmetric, no tenderness/mass/nodules Back: symmetric, no curvature. ROM normal. No CVA tenderness. Resp: clear to auscultation bilaterally Chest wall: no tenderness Cardio: regular rate and rhythm, S1, S2 normal, no murmur, click, rub or gallop GI: soft, non-tender; bowel sounds normal; no masses,  no organomegaly Extremities: extremities normal, atraumatic, no cyanosis or edema Pulses: 2+ and symmetric Skin: Skin color, texture, turgor normal. No  rashes or lesions Neurologic: Grossly normal  Lab Results:  Recent Labs  01/27/15 0730 01/28/15 0440  WBC 62.1* 31.4*  HGB 9.6* 9.1*  HCT 26.1* 25.8*  PLT 292 286   BMET  Recent Labs  01/26/15 0834 01/27/15 0730  NA 139 138  K 4.1 3.7  CL 105 103  CO2 24 27  GLUCOSE 101* 96  BUN 16 11  CREATININE 1.11 0.88  CALCIUM 8.1* 8.4    Studies/Results: No results found.  Medications: I have reviewed the patient's current medications.  Assessment/Plan: A 24 year old gentleman admitted with sickle cell painful crisis sepsis and streptococcal bacteremia next  #1 streptococcal bacteremia: Source not entirely clear but could still be due to pharyngitis that was not completely treated. He is responding to Rocephin no fever and white count is improving. We'll continue Rocephin for now until culture and sensitivities are back next  #2 sickle cell painful crisis: Patient seems to be responding to current regimen. I will make no changes today.  #3 sickle cell anemia: Hemoglobin seems to be reasonable. Continue monitoring. Next  #4 leukemoid reaction: Due to patient's lack of spleen function. This is improving already with antibiotics.  LOS: 3 days   [name],[name] 01/28/2015, 11:19 AM

## 2015-01-28 NOTE — Progress Notes (Signed)
Patient on High dose Dilaudid PCA,used 41.6 mg in 24 hours,70-total demands,61-delivered.

## 2015-01-29 LAB — CBC WITH DIFFERENTIAL/PLATELET
Basophils Absolute: 0.1 10*3/uL (ref 0.0–0.1)
Basophils Relative: 1 % (ref 0–1)
Eosinophils Absolute: 1.2 10*3/uL — ABNORMAL HIGH (ref 0.0–0.7)
Eosinophils Relative: 8 % — ABNORMAL HIGH (ref 0–5)
HCT: 25.6 % — ABNORMAL LOW (ref 39.0–52.0)
HEMOGLOBIN: 9 g/dL — AB (ref 13.0–17.0)
Lymphocytes Relative: 30 % (ref 12–46)
Lymphs Abs: 4.3 10*3/uL — ABNORMAL HIGH (ref 0.7–4.0)
MCH: 27.4 pg (ref 26.0–34.0)
MCHC: 35.2 g/dL (ref 30.0–36.0)
MCV: 77.8 fL — ABNORMAL LOW (ref 78.0–100.0)
MONO ABS: 1.6 10*3/uL — AB (ref 0.1–1.0)
Monocytes Relative: 11 % (ref 3–12)
NEUTROS PCT: 50 % (ref 43–77)
NRBC: 5 /100{WBCs} — AB
Neutro Abs: 7.2 10*3/uL (ref 1.7–7.7)
Platelets: 287 10*3/uL (ref 150–400)
RBC: 3.29 MIL/uL — AB (ref 4.22–5.81)
RDW: 17.3 % — ABNORMAL HIGH (ref 11.5–15.5)
WBC: 14.4 10*3/uL — ABNORMAL HIGH (ref 4.0–10.5)

## 2015-01-29 LAB — COMPREHENSIVE METABOLIC PANEL
ALBUMIN: 3.4 g/dL — AB (ref 3.5–5.2)
ALK PHOS: 84 U/L (ref 39–117)
ALT: 13 U/L (ref 0–53)
AST: 30 U/L (ref 0–37)
Anion gap: 6 (ref 5–15)
BUN: 6 mg/dL (ref 6–23)
CO2: 31 mmol/L (ref 19–32)
Calcium: 8.9 mg/dL (ref 8.4–10.5)
Chloride: 103 mmol/L (ref 96–112)
Creatinine, Ser: 0.62 mg/dL (ref 0.50–1.35)
GFR calc Af Amer: >90 mL/min (ref >90)
GLUCOSE: 92 mg/dL (ref 70–99)
Potassium: 3.7 mmol/L (ref 3.5–5.1)
Sodium: 140 mmol/L (ref 135–145)
Total Bilirubin: 1.6 mg/dL — ABNORMAL HIGH (ref 0.3–1.2)
Total Protein: 6.4 g/dL (ref 6.0–8.3)

## 2015-01-29 NOTE — Progress Notes (Signed)
Subjective: Patient feels better this morning except he had nausea and vomiting. Pain is down to 6 out of 10. He had no fever or chills. He woke up fine and then started having nausea and abdominal pain and then vomited 1 time. He still on the Dilaudid PCA and has used 37 mg with 61 demands and 60 deliveries in the last 24 hours. He also has physician assisted dosing at 1 mg every 3 hours as well as his Toradol. He has been able to move around on walk around without problem.  Objective: Vital signs in last 24 hours: Temp:  [98.3 F (36.8 C)-98.8 F (37.1 C)] 98.4 F (36.9 C) (03/19 0541) Pulse Rate:  [70-84] 70 (03/19 0541) Resp:  [11-16] 12 (03/19 0855) BP: (121-148)/(61-74) 121/61 mmHg (03/19 0541) SpO2:  [9 %-100 %] 97 % (03/19 0855) Weight:  [58.151 kg (128 lb 3.2 oz)] 58.151 kg (128 lb 3.2 oz) (03/19 0541) Weight change: 0.363 kg (12.8 oz) Last BM Date: 01/28/15  Intake/Output from previous day: 03/18 0701 - 03/19 0700 In: 3140 [P.O.:1190; I.V.:1900; IV Piggyback:50] Out: 3700 [Urine:3700] Intake/Output this shift: Total I/O In: -  Out: 450 [Urine:450]  General appearance: alert, cooperative and no distress Eyes: conjunctivae/corneas clear. PERRL, EOM's intact. Fundi benign. Neck: no adenopathy, no carotid bruit, no JVD, supple, symmetrical, trachea midline and thyroid not enlarged, symmetric, no tenderness/mass/nodules Back: symmetric, no curvature. ROM normal. No CVA tenderness. Resp: clear to auscultation bilaterally Chest wall: no tenderness Cardio: regular rate and rhythm, S1, S2 normal, no murmur, click, rub or gallop GI: soft, non-tender; bowel sounds normal; no masses,  no organomegaly Extremities: extremities normal, atraumatic, no cyanosis or edema Pulses: 2+ and symmetric Skin: Skin color, texture, turgor normal. No rashes or lesions Neurologic: Grossly normal  Lab Results:  Recent Labs  01/28/15 0440 01/29/15 0445  WBC 31.4* 14.4*  HGB 9.1* 9.0*  HCT  25.8* 25.6*  PLT 286 287   BMET  Recent Labs  01/27/15 0730 01/29/15 0445  NA 138 140  K 3.7 3.7  CL 103 103  CO2 27 31  GLUCOSE 96 92  BUN 11 6  CREATININE 0.88 0.62  CALCIUM 8.4 8.9    Studies/Results: No results found.  Medications: I have reviewed the patient's current medications.  Assessment/Plan: A 24 year old gentleman admitted with sickle cell painful crisis sepsis and streptococcal bacteremia next  #1 streptococcal bacteremia: Patient continues to do very well. No fever or chills. His bacteria is sensitive to Levaquin and Rocephin pain as well as penicillin. We will keep him on the receptive pain but when ready for discharge we can switch to Levaquin for 14 day total treatment.  #2 sickle cell painful crisis: Continue with current Dilaudid dosing as well as Toradol. 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  #4 leukemoid reaction: Much improved. White count is down to 14,000 today.    LOS: 4 days   Garrett Valdez,[name] 01/29/2015, 11:51 AM

## 2015-01-30 MED ORDER — LEVOFLOXACIN 750 MG PO TABS: 750.0000 mg | ORAL_TABLET | Freq: Every day | ORAL | Status: AC

## 2015-01-30 NOTE — Discharge Summary (Signed)
Physician Discharge Summary  Patient ID: Garrett Valdez MRN: [id number] DOB/AGE: 04-Aug-1991 24 y.o.  Admit date: 01/25/2015 Discharge date: 01/30/2015  Admission Diagnoses:  Discharge Diagnoses:  Active Problems:   Hb-S/hb-C disease with crisis   Gram-positive bacteremia   Leukemoid reaction   Discharged Condition: good  Hospital Course: Patient is a 24 year old gentleman with sickle cell disease who was admitted with sepsis later found to be due to streptococcal bacteremia of unknown source. He is had recurrent pharyngitis but no positive throat cultures. He has been treated as an outpatient prior to admission but came in with significant fever or chills and positive blood cultures. This triggered sickle cell crisis. He was admitted started initially on vancomycin but later transitioned to IV Rocephin after cultures came back. Patient also has significant leukemoid reaction with white count as high as 62,000. At the time of discharge he has been stable white count is down to normal range and patient was afebrile. Based on his blood cultures he was discharged on Levaquin to take a total of 14 days treatment. He is to resume work in 48 hours. Patient was treated with IV Dilaudid and Toradol for his sickle cell crisis while in the hospital. He was back to his baseline at the time of discharge.  Consults: None  Significant Diagnostic Studies: labs: CBCs, CMP is, blood cultures were all checked. Patient had streptococcal: Bacteremia 2 out of 2 blood cultures with sensitivities showings sensitive to Rocephin and Levaquin and ampicillin  Treatments: IV hydration, antibiotics: vancomycin and ceftriaxone and analgesia: acetaminophen and Dilaudid  Discharge Exam: Blood pressure 137/80, pulse 80, temperature 97.9 F (36.6 C), temperature source Oral, resp. rate 16, height  (1.753 m), weight 58.106 kg (128 lb 1.6 oz), SpO2 96 %. General appearance: alert, cooperative and no distress Eyes:  conjunctivae/corneas clear. PERRL, EOM's intact. Fundi benign. Neck: no adenopathy, no carotid bruit, no JVD, supple, symmetrical, trachea midline and thyroid not enlarged, symmetric, no tenderness/mass/nodules Back: symmetric, no curvature. ROM normal. No CVA tenderness. Resp: clear to auscultation bilaterally Chest wall: no tenderness Cardio: regular rate and rhythm, S1, S2 normal, no murmur, click, rub or gallop GI: soft, non-tender; bowel sounds normal; no masses,  no organomegaly Extremities: extremities normal, atraumatic, no cyanosis or edema Pulses: 2+ and symmetric Skin: Skin color, texture, turgor normal. No rashes or lesions Neurologic: Grossly normal  Disposition: 01-Home or Self Care     Medication List    TAKE these medications        chlorhexidine 0.12 % solution  Commonly known as:  PERIDEX  15 mLs by Mouth Rinse route 2 (two) times daily.     doxepin 10 MG capsule  Commonly known as:  SINEQUAN  Take 10 mg by mouth at bedtime.     folic acid 1 MG tablet  Commonly known as:  FOLVITE  Take 1 mg by mouth daily.     gabapentin 300 MG capsule  Commonly known as:  NEURONTIN  Take 300 mg by mouth 3 (three) times daily.     levofloxacin 750 MG tablet  Commonly known as:  LEVAQUIN  Take 1 tablet (750 mg total) by mouth daily.     lidocaine 5 %  Commonly known as:  LIDODERM  Place 2 patches onto the skin daily. Remove & Discard patch within 12 hours or as directed by MD     oxyCODONE 5 MG immediate release tablet  Commonly known as:  Oxy IR/ROXICODONE  Take 1 tablet (5 mg total) by  mouth every 4 (four) hours as needed for severe pain.     OxyCODONE 10 mg T12a 12 hr tablet  Commonly known as:  OXYCONTIN  Take 1 tablet (10 mg total) by mouth every 12 (twelve) hours.         SignedLonia Blood: [name],[name] 01/30/2015, 8:12 AM  Time spent 34 minutes

## 2015-01-30 NOTE — Progress Notes (Signed)
Patient discharged to home, discharge instructions reviewed with patient who verbalized understanding.  

## 2015-01-30 NOTE — Progress Notes (Signed)
Wasted 19 ml Dilaudid high concentration PCA in sink with Sharol Rousseliana Torres RN.
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
